# Patient Record
Sex: Male | Born: 1950 | Race: White | Hispanic: No | Marital: Single | Smoking: Never smoker
Health system: Southern US, Community
[De-identification: ages and names within clinical notes are randomized; demographics above are authoritative.]

## PROBLEM LIST (undated history)

## (undated) DIAGNOSIS — M51369 Other intervertebral disc degeneration, lumbar region without mention of lumbar back pain or lower extremity pain: Secondary | ICD-10-CM

## (undated) DIAGNOSIS — K219 Gastro-esophageal reflux disease without esophagitis: Secondary | ICD-10-CM

## (undated) DIAGNOSIS — G4730 Sleep apnea, unspecified: Secondary | ICD-10-CM

## (undated) DIAGNOSIS — I2510 Atherosclerotic heart disease of native coronary artery without angina pectoris: Secondary | ICD-10-CM

## (undated) DIAGNOSIS — R7303 Prediabetes: Secondary | ICD-10-CM

## (undated) DIAGNOSIS — M5136 Other intervertebral disc degeneration, lumbar region: Secondary | ICD-10-CM

## (undated) DIAGNOSIS — E119 Type 2 diabetes mellitus without complications: Secondary | ICD-10-CM

## (undated) DIAGNOSIS — C61 Malignant neoplasm of prostate: Secondary | ICD-10-CM

## (undated) DIAGNOSIS — K635 Polyp of colon: Secondary | ICD-10-CM

## (undated) DIAGNOSIS — N400 Enlarged prostate without lower urinary tract symptoms: Secondary | ICD-10-CM

## (undated) DIAGNOSIS — J189 Pneumonia, unspecified organism: Secondary | ICD-10-CM

## (undated) DIAGNOSIS — J45909 Unspecified asthma, uncomplicated: Secondary | ICD-10-CM

## (undated) HISTORY — DX: Malignant neoplasm of prostate: C61

## (undated) HISTORY — PX: VASECTOMY: SHX75

## (undated) HISTORY — DX: Unspecified asthma, uncomplicated: J45.909

## (undated) HISTORY — DX: Type 2 diabetes mellitus without complications: E11.9

## (undated) HISTORY — DX: Benign prostatic hyperplasia without lower urinary tract symptoms: N40.0

## (undated) HISTORY — DX: Other intervertebral disc degeneration, lumbar region without mention of lumbar back pain or lower extremity pain: M51.369

## (undated) HISTORY — DX: Gastro-esophageal reflux disease without esophagitis: K21.9

## (undated) HISTORY — DX: Polyp of colon: K63.5

## (undated) HISTORY — DX: Sleep apnea, unspecified: G47.30

## (undated) HISTORY — PX: KNEE SURGERY: SHX244

## (undated) HISTORY — DX: Other intervertebral disc degeneration, lumbar region: M51.36

---
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
## 2000-10-18 ENCOUNTER — Ambulatory Visit (HOSPITAL_BASED_OUTPATIENT_CLINIC_OR_DEPARTMENT_OTHER): Admission: RE | Admit: 2000-10-18 | Discharge: 2000-10-18 | Payer: Self-pay | Admitting: Internal Medicine

## 2004-06-01 ENCOUNTER — Encounter: Admission: RE | Admit: 2004-06-01 | Discharge: 2004-06-01 | Payer: Self-pay | Admitting: Family Medicine

## 2005-03-02 ENCOUNTER — Encounter: Admission: RE | Admit: 2005-03-02 | Discharge: 2005-03-02 | Payer: Self-pay | Admitting: Family Medicine

## 2013-06-03 ENCOUNTER — Encounter: Payer: Self-pay | Admitting: Family Medicine

## 2013-06-03 ENCOUNTER — Ambulatory Visit (INDEPENDENT_AMBULATORY_CARE_PROVIDER_SITE_OTHER): Payer: 59 | Admitting: Family Medicine

## 2013-06-03 VITALS — BP 126/78 | HR 88 | Temp 98.9°F | Resp 20 | Ht 73.0 in | Wt 263.0 lb

## 2013-06-03 DIAGNOSIS — J209 Acute bronchitis, unspecified: Secondary | ICD-10-CM

## 2013-06-03 DIAGNOSIS — N400 Enlarged prostate without lower urinary tract symptoms: Secondary | ICD-10-CM | POA: Insufficient documentation

## 2013-06-03 MED ORDER — ZOSTER VACCINE LIVE 19400 UNT/0.65ML ~~LOC~~ SOLR: 0.6500 mL | Freq: Once | SUBCUTANEOUS | Status: DC

## 2013-06-03 MED ORDER — PREDNISONE 20 MG PO TABS: ORAL_TABLET | ORAL | Status: DC

## 2013-06-03 NOTE — Progress Notes (Signed)
  Subjective:    Patient ID: Melvin Morrison, male    DOB: [birth year], 62 y.o.   MRN: [id number]  HPI  Patient reports 3 days of worsening cough, increased wheezing, chest tightness, and mild central pleurisy underneath his sternum.  He denies fevers chills, a productive cough, or hemoptysis.  He has a history of moderate persistent asthma. He has not had an exacerbation in 5 years.  He has no inhaler at home. This began as an upper respiratory infection with some mild rhinorrhea and scratchy throat. Past Medical History  Diagnosis Date  . Asthma   . GERD (gastroesophageal reflux disease)   . BPH (benign prostatic hyperplasia)   . DDD (degenerative disc disease), lumbar     L4-5  . Colon polyps    No current outpatient prescriptions on file prior to visit.   No current facility-administered medications on file prior to visit.   No Known Allergies History   Social History  . Marital Status: Single    Spouse Name: N/A    Number of Children: N/A  . Years of Education: N/A   Occupational History  . Not on file.   Social History Main Topics  . Smoking status: Never Smoker   . Smokeless tobacco: Never Used  . Alcohol Use: Yes     Comment: rare  . Drug Use: No  . Sexual Activity: Yes   Other Topics Concern  . Not on file   Social History Narrative  . No narrative on file   Family History  Problem Relation Age of Onset  . Diabetes Mother   . Diabetes Maternal Grandmother      Review of Systems  All other systems reviewed and are negative.       Objective:   Physical Exam  Vitals reviewed. Constitutional: He appears well-developed and well-nourished.  HENT:  Right Ear: External ear normal.  Left Ear: External ear normal.  Nose: Nose normal.  Mouth/Throat: Oropharynx is clear and moist. No oropharyngeal exudate.  Eyes: Conjunctivae are normal. Pupils are equal, round, and reactive to light.  Neck: Neck supple.  Cardiovascular: Normal rate, regular rhythm and  normal heart sounds.  Exam reveals no gallop and no friction rub.   No murmur heard. Pulmonary/Chest: Effort normal. No respiratory distress. He has wheezes. He has no rales.  Abdominal: Soft. Bowel sounds are normal.  Lymphadenopathy:    He has no cervical adenopathy.   Patient has a prolonged expiratory phase with occasional expiratory wheezes.  There are no crackles/rhonchi/rales on examination.       Assessment & Plan:  1. Acute bronchitis Believe the patient likely had a viral upper respiratory infection which has triggered an asthma exacerbation. He believes he is gradually getting better. Therefore I gave the patient a sample of Ventolin and instructed him to use 2 puffs every 4-6 hours as needed for wheezing or coughing. Also recommended over-the-counter Mucinex for congestion. I recommended tincture of time. If symptoms worsen I would have a low threshold for him to begin taking a prednisone taper pack. I gave him a prescription for prednisone taper pack and instructed him not to begin taking it yet unless his asthma exacerbation worsens. Recheck in 48 hours if no better or sooner if worse. - predniSONE (DELTASONE) 20 MG tablet; 3 tabs poqday 1-2, 1 tab poqday 3-4, 1 poqday 5-6  Dispense: 12 tablet; Refill: 0

## 2013-09-19 ENCOUNTER — Ambulatory Visit (INDEPENDENT_AMBULATORY_CARE_PROVIDER_SITE_OTHER): Payer: 59 | Admitting: Family Medicine

## 2013-09-19 ENCOUNTER — Encounter: Payer: Self-pay | Admitting: Family Medicine

## 2013-09-19 VITALS — BP 130/70 | HR 82 | Temp 98.5°F | Resp 20 | Ht 73.0 in | Wt 264.0 lb

## 2013-09-19 DIAGNOSIS — J45909 Unspecified asthma, uncomplicated: Secondary | ICD-10-CM

## 2013-09-19 MED ORDER — ALBUTEROL SULFATE HFA 108 (90 BASE) MCG/ACT IN AERS: 2.0000 | INHALATION_SPRAY | Freq: Four times a day (QID) | RESPIRATORY_TRACT | Status: DC | PRN

## 2013-09-19 MED ORDER — PREDNISONE 20 MG PO TABS: ORAL_TABLET | ORAL | Status: DC

## 2013-09-19 NOTE — Progress Notes (Signed)
   Subjective:    Patient ID: Melvin Morrison, male    DOB: November 07, 1950, 63 y.o.   MRN: [id number]  HPI Patient presents with one week of chest congestion cough and wheezing. He has underlying history of asthma. He is having use his albuterol 2 puffs every 6 hours as needed for wheezing. His cough is nonproductive. He denies any fevers or chills. He denies any rhinorrhea. He reports some shortness of breath. He denies any pleurisy. He denies any hemoptysis. Past Medical History  Diagnosis Date  . Asthma   . GERD (gastroesophageal reflux disease)   . BPH (benign prostatic hyperplasia)   . DDD (degenerative disc disease), lumbar     L4-5  . Colon polyps    Current Outpatient Prescriptions on File Prior to Visit  Medication Sig Dispense Refill  . alfuzosin (UROXATRAL) 10 MG 24 hr tablet Take 10 mg by mouth daily.      Marland Kitchen. omeprazole (PRILOSEC OTC) 20 MG tablet Take 20 mg by mouth daily.       No current facility-administered medications on file prior to visit.   No Known Allergies History   Social History  . Marital Status: Single    Spouse Name: N/A    Number of Children: N/A  . Years of Education: N/A   Occupational History  . Not on file.   Social History Main Topics  . Smoking status: Never Smoker   . Smokeless tobacco: Never Used  . Alcohol Use: Yes     Comment: rare  . Drug Use: No  . Sexual Activity: Yes   Other Topics Concern  . Not on file   Social History Narrative  . No narrative on file      Review of Systems  All other systems reviewed and are negative.       Objective:   Physical Exam  Vitals reviewed. HENT:  Right Ear: External ear normal.  Left Ear: External ear normal.  Nose: Nose normal.  Mouth/Throat: Oropharynx is clear and moist. No oropharyngeal exudate.  Eyes: Conjunctivae are normal. Pupils are equal, round, and reactive to light. No scleral icterus.  Neck: Normal range of motion. Neck supple.  Cardiovascular: Normal rate, regular  rhythm and normal heart sounds.   Pulmonary/Chest: Effort normal. No respiratory distress. He has wheezes. He has no rales. He exhibits no tenderness.  Lymphadenopathy:    He has no cervical adenopathy.          Assessment & Plan:  1. Asthmatic bronchitis Patient had a viral upper respiratory infection which triggered an asthmatic exacerbation.  Begin prednisone taper pack. Use albuterol 2 puffs inhaled every 6 hours as needed. Use Mucinex DM as needed for cough.  Patient develops fever or pleurisy he is to fill a prescription for a Z-Pak. - predniSONE (DELTASONE) 20 MG tablet; 3 tabs poqday poqday 1-2, 2 tabs poqday 3-4, 1 tab poqday 5-6  Dispense: 12 tablet; Refill: 0 - albuterol (VENTOLIN HFA) 108 (90 BASE) MCG/ACT inhaler; Inhale 2 puffs into the lungs every 6 (six) hours as needed for wheezing or shortness of breath.  Dispense: 1 Inhaler; Refill: 3

## 2013-11-10 ENCOUNTER — Ambulatory Visit (INDEPENDENT_AMBULATORY_CARE_PROVIDER_SITE_OTHER): Payer: 59 | Admitting: Family Medicine

## 2013-11-10 ENCOUNTER — Encounter: Payer: Self-pay | Admitting: Family Medicine

## 2013-11-10 VITALS — BP 118/74 | HR 120 | Temp 100.9°F | Resp 24 | Ht 73.0 in | Wt 260.0 lb

## 2013-11-10 DIAGNOSIS — J209 Acute bronchitis, unspecified: Secondary | ICD-10-CM

## 2013-11-10 MED ORDER — AZITHROMYCIN 250 MG PO TABS: ORAL_TABLET | ORAL | Status: DC

## 2013-11-10 NOTE — Progress Notes (Signed)
Subjective:    Patient ID: Melvin Morrison, male    DOB: 07/05/1951, 63 y.o.   MRN: [id number]  HPI 09/26/13 Patient presents with one week of chest congestion cough and wheezing. He has underlying history of asthma. He is having use his albuterol 2 puffs every 6 hours as needed for wheezing. His cough is nonproductive. He denies any fevers or chills. He denies any rhinorrhea. He reports some shortness of breath. He denies any pleurisy. He denies any hemoptysis.  At that time, my plan was: 1. Asthmatic bronchitis Patient had a viral upper respiratory infection which triggered an asthmatic exacerbation.  Begin prednisone taper pack. Use albuterol 2 puffs inhaled every 6 hours as needed. Use Mucinex DM as needed for cough.  Patient develops fever or pleurisy he is to fill a prescription for a Z-Pak. - predniSONE (DELTASONE) 20 MG tablet; 3 tabs poqday poqday 1-2, 2 tabs poqday 3-4, 1 tab poqday 5-6  Dispense: 12 tablet; Refill: 0 - albuterol (VENTOLIN HFA) 108 (90 BASE) MCG/ACT inhaler; Inhale 2 puffs into the lungs every 6 (six) hours as needed for wheezing or shortness of breath.  Dispense: 1 Inhaler; Refill: 3  11/10/13 Patient symptoms improved but never completely went away. Over the last week he developed severe wheezing, cough that is productive of yellow sputum, and fever of 100.9-101. He is requiring his albuterol to 4 times a day. He is currently having use his albuterol when he gets sick. This happens 2-3 times every dinner. He also frequently needs his albuterol even when he's not sick.  Past Medical History  Diagnosis Date  . Asthma   . GERD (gastroesophageal reflux disease)   . BPH (benign prostatic hyperplasia)   . DDD (degenerative disc disease), lumbar     L4-5  . Colon polyps    Current Outpatient Prescriptions on File Prior to Visit  Medication Sig Dispense Refill  . albuterol (VENTOLIN HFA) 108 (90 BASE) MCG/ACT inhaler Inhale 2 puffs into the lungs every 6 (six) hours as  needed for wheezing or shortness of breath.  1 Inhaler  3  . alfuzosin (UROXATRAL) 10 MG 24 hr tablet Take 10 mg by mouth daily.      Marland Kitchen. omeprazole (PRILOSEC OTC) 20 MG tablet Take 20 mg by mouth daily.       No current facility-administered medications on file prior to visit.   No Known Allergies History   Social History  . Marital Status: Single    Spouse Name: N/A    Number of Children: N/A  . Years of Education: N/A   Occupational History  . Not on file.   Social History Main Topics  . Smoking status: Never Smoker   . Smokeless tobacco: Never Used  . Alcohol Use: Yes     Comment: rare  . Drug Use: No  . Sexual Activity: Yes   Other Topics Concern  . Not on file   Social History Narrative  . No narrative on file      Review of Systems  All other systems reviewed and are negative.       Objective:   Physical Exam  Vitals reviewed. HENT:  Right Ear: External ear normal.  Left Ear: External ear normal.  Nose: Nose normal.  Mouth/Throat: Oropharynx is clear and moist. No oropharyngeal exudate.  Eyes: Conjunctivae are normal. Pupils are equal, round, and reactive to light. No scleral icterus.  Neck: Normal range of motion. Neck supple.  Cardiovascular: Normal rate, regular rhythm and  normal heart sounds.   Pulmonary/Chest: Effort normal. No respiratory distress. He has wheezes. He has no rales. He exhibits no tenderness.  Lymphadenopathy:    He has no cervical adenopathy.          Assessment & Plan:  1. Acute bronchitis I believe the patient has developed bronchitis. I recommended he start taking a Z-Pak. While this will treat the infection today, I believe we can do a better job at preventing his future asthma exacerbations. Therefore I recommended a trial of Symbicort 160/4.5 2 puffs inhaled twice a day. I gave patient samples to last one month. He is to call me in one month to let me know the medicines are helping him reduce his use of rescue  medication.   azithromycin (ZITHROMAX) 250 MG tablet; 2 tabs poqday1, 1 tab poqday 2-5  Dispense: 6 tablet; Refill: 0

## 2013-12-18 ENCOUNTER — Other Ambulatory Visit: Payer: 59

## 2013-12-18 DIAGNOSIS — Z0000 Encounter for general adult medical examination without abnormal findings: Secondary | ICD-10-CM

## 2013-12-18 DIAGNOSIS — E785 Hyperlipidemia, unspecified: Secondary | ICD-10-CM

## 2013-12-18 DIAGNOSIS — Z125 Encounter for screening for malignant neoplasm of prostate: Secondary | ICD-10-CM

## 2013-12-18 LAB — CBC WITH DIFFERENTIAL/PLATELET
BASOS ABS: 0.1 10*3/uL (ref 0.0–0.1)
Basophils Relative: 1 % (ref 0–1)
EOS PCT: 3 % (ref 0–5)
Eosinophils Absolute: 0.2 10*3/uL (ref 0.0–0.7)
HCT: 44.3 % (ref 39.0–52.0)
Hemoglobin: 14.9 g/dL (ref 13.0–17.0)
LYMPHS ABS: 1.8 10*3/uL (ref 0.7–4.0)
LYMPHS PCT: 27 % (ref 12–46)
MCH: 29.2 pg (ref 26.0–34.0)
MCHC: 33.6 g/dL (ref 30.0–36.0)
MCV: 86.9 fL (ref 78.0–100.0)
MONOS PCT: 11 % (ref 3–12)
Monocytes Absolute: 0.7 10*3/uL (ref 0.1–1.0)
NEUTROS ABS: 3.9 10*3/uL (ref 1.7–7.7)
Neutrophils Relative %: 58 % (ref 43–77)
PLATELETS: 248 10*3/uL (ref 150–400)
RBC: 5.1 MIL/uL (ref 4.22–5.81)
RDW: 13.4 % (ref 11.5–15.5)
WBC: 6.8 10*3/uL (ref 4.0–10.5)

## 2013-12-18 LAB — COMPLETE METABOLIC PANEL WITH GFR
ALBUMIN: 4.1 g/dL (ref 3.5–5.2)
ALK PHOS: 63 U/L (ref 39–117)
ALT: 28 U/L (ref 0–53)
AST: 17 U/L (ref 0–37)
BILIRUBIN TOTAL: 1.6 mg/dL — AB (ref 0.2–1.2)
BUN: 16 mg/dL (ref 6–23)
CHLORIDE: 103 meq/L (ref 96–112)
CO2: 28 meq/L (ref 19–32)
CREATININE: 1.08 mg/dL (ref 0.50–1.35)
Calcium: 9.3 mg/dL (ref 8.4–10.5)
GFR, EST AFRICAN AMERICAN: 85 mL/min
GFR, Est Non African American: 74 mL/min
Glucose, Bld: 81 mg/dL (ref 70–99)
Potassium: 4.4 mEq/L (ref 3.5–5.3)
Sodium: 138 mEq/L (ref 135–145)
TOTAL PROTEIN: 6.5 g/dL (ref 6.0–8.3)

## 2013-12-18 LAB — LIPID PANEL
CHOL/HDL RATIO: 4.7 ratio
Cholesterol: 168 mg/dL (ref 0–200)
HDL: 36 mg/dL — AB (ref >39)
LDL CALC: 115 mg/dL — AB (ref 0–99)
TRIGLYCERIDES: 83 mg/dL (ref <150)
VLDL: 17 mg/dL (ref 0–40)

## 2013-12-19 LAB — PSA: PSA: 0.64 ng/mL (ref <=4.00)

## 2013-12-25 ENCOUNTER — Ambulatory Visit (INDEPENDENT_AMBULATORY_CARE_PROVIDER_SITE_OTHER): Payer: 59 | Admitting: Family Medicine

## 2013-12-25 ENCOUNTER — Encounter: Payer: Self-pay | Admitting: Family Medicine

## 2013-12-25 VITALS — BP 146/78 | HR 72 | Temp 98.0°F | Resp 18 | Ht 72.5 in | Wt 259.0 lb

## 2013-12-25 DIAGNOSIS — Z23 Encounter for immunization: Secondary | ICD-10-CM

## 2013-12-25 DIAGNOSIS — Z0000 Encounter for general adult medical examination without abnormal findings: Secondary | ICD-10-CM

## 2013-12-25 MED ORDER — FLUTICASONE-SALMETEROL 250-50 MCG/DOSE IN AEPB: 1.0000 | INHALATION_SPRAY | Freq: Two times a day (BID) | RESPIRATORY_TRACT | Status: DC

## 2013-12-25 MED ORDER — BUDESONIDE-FORMOTEROL FUMARATE 160-4.5 MCG/ACT IN AERO: 2.0000 | INHALATION_SPRAY | Freq: Two times a day (BID) | RESPIRATORY_TRACT | Status: DC

## 2013-12-25 NOTE — Addendum Note (Signed)
Addended by: Legrand RamsWILLIS, SANDY B on: 12/25/2013 09:11 AM   Modules accepted: Orders

## 2013-12-25 NOTE — Progress Notes (Signed)
Subjective:    Patient ID: Melvin Morrison, male    DOB: January 15, 1951, 63 y.o.   MRN: [id number]  HPI Patient is a very pleasant 63 year old white male who comes in today for complete physical exam. He has no medical concerns. His last colonoscopy was in 2013 has his prostate checked every year by his urologist. His PSA this month was completely normal. Tetanus shot was in 2008. He has never had a pneumonia vaccine. His most recent labwork as listed below: Lab on 12/18/2013  Component Date Value Ref Range Status  . Cholesterol 12/18/2013 168  0 - 200 mg/dL Final   Comment: ATP III Classification:                                < 200        mg/dL        Desirable                               200 - 239     mg/dL        Borderline High                               >= 240        mg/dL        High                             . Triglycerides 12/18/2013 83  <150 mg/dL Final  . HDL 12/18/2013 36* >39 mg/dL Final  . Total CHOL/HDL Ratio 12/18/2013 4.7   Final  . VLDL 12/18/2013 17  0 - 40 mg/dL Final  . LDL Cholesterol 12/18/2013 115* 0 - 99 mg/dL Final   Comment:                            Total Cholesterol/HDL Ratio:CHD Risk                                                 Coronary Heart Disease Risk Table                                                                 Men       Women                                   1/2 Average Risk              3.4        3.3                                       Average Risk  5.0        4.4                                    2X Average Risk              9.6        7.1                                    3X Average Risk             23.4       11.0                          Use the calculated Patient Ratio above and the CHD Risk table                           to determine the patient's CHD Risk.                          ATP III Classification (LDL):                                < 100        mg/dL         Optimal   100 - 129     mg/dL         Near or Above Optimal                               130 - 159     mg/dL         Borderline High                               160 - 189     mg/dL         High                                > 190        mg/dL         Very High                             . PSA 12/18/2013 0.64  <=4.00 ng/mL Final   Comment: Test Methodology: ECLIA PSA (Electrochemiluminescence Immunoassay)                                                     For PSA values from 2.5-4.0, particularly in younger men <60 years                          old, the AUA and NCCN suggest testing for % Free PSA (3515) and  evaluation of the rate of increase in PSA (PSA velocity).  . WBC 12/18/2013 6.8  4.0 - 10.5 K/uL Final  . RBC 12/18/2013 5.10  4.22 - 5.81 MIL/uL Final  . Hemoglobin 12/18/2013 14.9  13.0 - 17.0 g/dL Final  . HCT 12/18/2013 44.3  39.0 - 52.0 % Final  . MCV 12/18/2013 86.9  78.0 - 100.0 fL Final  . MCH 12/18/2013 29.2  26.0 - 34.0 pg Final  . MCHC 12/18/2013 33.6  30.0 - 36.0 g/dL Final  . RDW 12/18/2013 13.4  11.5 - 15.5 % Final  . Platelets 12/18/2013 248  150 - 400 K/uL Final  . Neutrophils Relative % 12/18/2013 58  43 - 77 % Final  . Neutro Abs 12/18/2013 3.9  1.7 - 7.7 K/uL Final  . Lymphocytes Relative 12/18/2013 27  12 - 46 % Final  . Lymphs Abs 12/18/2013 1.8  0.7 - 4.0 K/uL Final  . Monocytes Relative 12/18/2013 11  3 - 12 % Final  . Monocytes Absolute 12/18/2013 0.7  0.1 - 1.0 K/uL Final  . Eosinophils Relative 12/18/2013 3  0 - 5 % Final  . Eosinophils Absolute 12/18/2013 0.2  0.0 - 0.7 K/uL Final  . Basophils Relative 12/18/2013 1  0 - 1 % Final  . Basophils Absolute 12/18/2013 0.1  0.0 - 0.1 K/uL Final  . Smear Review 12/18/2013 Criteria for review not met   Final  . Sodium 12/18/2013 138  135 - 145 mEq/L Final  . Potassium 12/18/2013 4.4  3.5 - 5.3 mEq/L Final  . Chloride 12/18/2013 103  96 - 112 mEq/L Final  . CO2 12/18/2013 28  19 - 32 mEq/L  Final  . Glucose, Bld 12/18/2013 81  70 - 99 mg/dL Final  . BUN 12/18/2013 16  6 - 23 mg/dL Final  . Creat 12/18/2013 1.08  0.50 - 1.35 mg/dL Final  . Total Bilirubin 12/18/2013 1.6* 0.2 - 1.2 mg/dL Final  . Alkaline Phosphatase 12/18/2013 63  39 - 117 U/L Final  . AST 12/18/2013 17  0 - 37 U/L Final  . ALT 12/18/2013 28  0 - 53 U/L Final  . Total Protein 12/18/2013 6.5  6.0 - 8.3 g/dL Final  . Albumin 12/18/2013 4.1  3.5 - 5.2 g/dL Final  . Calcium 12/18/2013 9.3  8.4 - 10.5 mg/dL Final  . GFR, Est African American 12/18/2013 85   Final  . GFR, Est Non African American 12/18/2013 74   Final   Comment:                            The estimated GFR is a calculation valid for adults (>=41 years old)                          that uses the CKD-EPI algorithm to adjust for age and sex. It is                            not to be used for children, pregnant women, hospitalized patients,                             patients on dialysis, or with rapidly changing kidney function.  According to the NKDEP, eGFR >89 is normal, 60-89 shows mild                          impairment, 30-59 shows moderate impairment, 15-29 shows severe                          impairment and <15 is ESRD.                              Past Medical History  Diagnosis Date  . Asthma   . GERD (gastroesophageal reflux disease)   . BPH (benign prostatic hyperplasia)   . DDD (degenerative disc disease), lumbar     L4-5  . Colon polyps    No past surgical history on file. Current Outpatient Prescriptions on File Prior to Visit  Medication Sig Dispense Refill  . alfuzosin (UROXATRAL) 10 MG 24 hr tablet Take 10 mg by mouth daily.      Marland Kitchen omeprazole (PRILOSEC OTC) 20 MG tablet Take 20 mg by mouth daily.       No current facility-administered medications on file prior to visit.   No Known Allergies History   Social History  . Marital Status: Single    Spouse Name: N/A    Number of Children: N/A    . Years of Education: N/A   Occupational History  . Not on file.   Social History Main Topics  . Smoking status: Never Smoker   . Smokeless tobacco: Never Used  . Alcohol Use: Yes     Comment: rare  . Drug Use: No  . Sexual Activity: Yes     Comment: married, Furniture conservator/restorer.   Other Topics Concern  . Not on file   Social History Narrative  . No narrative on file   Family History  Problem Relation Age of Onset  . Diabetes Mother   . Diabetes Maternal Grandmother       Review of Systems  All other systems reviewed and are negative.      Objective:   Physical Exam  Vitals reviewed. Constitutional: He is oriented to person, place, and time. He appears well-developed and well-nourished. No distress.  HENT:  Head: Normocephalic and atraumatic.  Right Ear: External ear normal.  Left Ear: External ear normal.  Nose: Nose normal.  Mouth/Throat: Oropharynx is clear and moist. No oropharyngeal exudate.  Eyes: Conjunctivae and EOM are normal. Pupils are equal, round, and reactive to light. Right eye exhibits no discharge. Left eye exhibits no discharge. No scleral icterus.  Neck: Normal range of motion. Neck supple. No JVD present. No tracheal deviation present. No thyromegaly present.  Cardiovascular: Normal rate, regular rhythm, normal heart sounds and intact distal pulses.  Exam reveals no gallop and no friction rub.   No murmur heard. Pulmonary/Chest: Effort normal and breath sounds normal. No stridor. No respiratory distress. He has no wheezes. He has no rales. He exhibits no tenderness.  Abdominal: Soft. Bowel sounds are normal. He exhibits no distension and no mass. There is no tenderness. There is no rebound and no guarding.  Musculoskeletal: Normal range of motion. He exhibits no edema and no tenderness.  Lymphadenopathy:    He has no cervical adenopathy.  Neurological: He is alert and oriented to person, place, and time. He has normal reflexes. He displays normal  reflexes. No cranial nerve deficit. He exhibits normal muscle tone.  Coordination normal.  Skin: Skin is warm. No rash noted. He is not diaphoretic. No erythema. No pallor.  Psychiatric: He has a normal mood and affect. His behavior is normal. Judgment and thought content normal.          Assessment & Plan:  1. Routine general medical examination at a health care facility Physical exam today is significant for elevated weight and mild elevated blood pressures the patient has never had elevated the past. Recommended he check his blood pressure frequently at home and consistently through the top of a greater than 140 greater than 90 incident we continued. His lab is excellent. He would have his rectal exam performed at his urologist later this month. Did give the patient Pneumovax 23 because of his asthma history. I recommend 13 and 65. I recommend a booster of Pneumovax 23 in 5 years. His colonoscopy is up to date.

## 2013-12-25 NOTE — Addendum Note (Signed)
Addended by: Legrand RamsWILLIS, Trinka Keshishyan B on: 12/25/2013 09:41 AM   Modules accepted: Orders, Medications

## 2014-03-03 ENCOUNTER — Other Ambulatory Visit: Payer: Self-pay | Admitting: Orthopedic Surgery

## 2014-03-03 DIAGNOSIS — M25562 Pain in left knee: Secondary | ICD-10-CM

## 2014-03-06 ENCOUNTER — Other Ambulatory Visit: Payer: Self-pay

## 2014-03-11 ENCOUNTER — Ambulatory Visit
Admission: RE | Admit: 2014-03-11 | Discharge: 2014-03-11 | Disposition: A | Discharge status: Home / Self Care | Payer: 59 | Source: Ambulatory Visit | Attending: Orthopedic Surgery | Admitting: Orthopedic Surgery

## 2014-03-11 DIAGNOSIS — M25562 Pain in left knee: Secondary | ICD-10-CM

## 2014-05-04 ENCOUNTER — Telehealth: Payer: Self-pay | Admitting: Family Medicine

## 2014-05-04 DIAGNOSIS — K429 Umbilical hernia without obstruction or gangrene: Secondary | ICD-10-CM

## 2014-05-04 NOTE — Telephone Encounter (Signed)
When patient was in last, we told him he had a hernia, he is now ready to get something scheduled for this, he is saying he does not want to see dr gerkin at [facility]. Anybody else is fine  Phone number to call back is [phone]

## 2014-05-04 NOTE — Telephone Encounter (Signed)
Ok with referral. 

## 2014-05-04 NOTE — Telephone Encounter (Signed)
Do you know anything about this? I do not see it in his note about a hernia.

## 2014-05-05 NOTE — Telephone Encounter (Signed)
[facility]

## 2014-05-07 NOTE — Telephone Encounter (Signed)
Umbilical hernia

## 2014-05-07 NOTE — Telephone Encounter (Signed)
Referral ordered

## 2014-05-26 ENCOUNTER — Other Ambulatory Visit (INDEPENDENT_AMBULATORY_CARE_PROVIDER_SITE_OTHER): Payer: Self-pay

## 2014-05-26 ENCOUNTER — Ambulatory Visit (INDEPENDENT_AMBULATORY_CARE_PROVIDER_SITE_OTHER): Payer: 59 | Admitting: General Surgery

## 2014-05-26 DIAGNOSIS — R222 Localized swelling, mass and lump, trunk: Secondary | ICD-10-CM

## 2014-06-01 ENCOUNTER — Other Ambulatory Visit (INDEPENDENT_AMBULATORY_CARE_PROVIDER_SITE_OTHER): Payer: Self-pay | Admitting: General Surgery

## 2014-06-01 ENCOUNTER — Ambulatory Visit
Admission: RE | Admit: 2014-06-01 | Discharge: 2014-06-01 | Disposition: A | Discharge status: Home / Self Care | Payer: 59 | Source: Ambulatory Visit | Attending: General Surgery | Admitting: General Surgery

## 2014-06-01 ENCOUNTER — Encounter (INDEPENDENT_AMBULATORY_CARE_PROVIDER_SITE_OTHER): Payer: Self-pay

## 2014-06-01 DIAGNOSIS — R222 Localized swelling, mass and lump, trunk: Secondary | ICD-10-CM

## 2014-07-03 ENCOUNTER — Other Ambulatory Visit: Payer: Self-pay

## 2015-01-11 ENCOUNTER — Ambulatory Visit (INDEPENDENT_AMBULATORY_CARE_PROVIDER_SITE_OTHER): Payer: 59 | Admitting: Family Medicine

## 2015-01-11 ENCOUNTER — Encounter: Payer: Self-pay | Admitting: Family Medicine

## 2015-01-11 VITALS — BP 110/70 | HR 62 | Temp 98.7°F | Resp 20 | Ht 73.0 in | Wt 268.0 lb

## 2015-01-11 DIAGNOSIS — J4520 Mild intermittent asthma, uncomplicated: Secondary | ICD-10-CM | POA: Diagnosis not present

## 2015-01-11 DIAGNOSIS — R5382 Chronic fatigue, unspecified: Secondary | ICD-10-CM

## 2015-01-11 LAB — CBC WITH DIFFERENTIAL/PLATELET
BASOS ABS: 0 10*3/uL (ref 0.0–0.1)
BASOS PCT: 0 % (ref 0–1)
EOS ABS: 0.2 10*3/uL (ref 0.0–0.7)
EOS PCT: 3 % (ref 0–5)
HCT: 47.2 % (ref 39.0–52.0)
HEMOGLOBIN: 15.7 g/dL (ref 13.0–17.0)
LYMPHS ABS: 2 10*3/uL (ref 0.7–4.0)
Lymphocytes Relative: 26 % (ref 12–46)
MCH: 29.6 pg (ref 26.0–34.0)
MCHC: 33.3 g/dL (ref 30.0–36.0)
MCV: 88.9 fL (ref 78.0–100.0)
MONOS PCT: 13 % — AB (ref 3–12)
MPV: 9.6 fL (ref 8.6–12.4)
Monocytes Absolute: 1 10*3/uL (ref 0.1–1.0)
NEUTROS ABS: 4.4 10*3/uL (ref 1.7–7.7)
NEUTROS PCT: 58 % (ref 43–77)
PLATELETS: 269 10*3/uL (ref 150–400)
RBC: 5.31 MIL/uL (ref 4.22–5.81)
RDW: 13.2 % (ref 11.5–15.5)
WBC: 7.6 10*3/uL (ref 4.0–10.5)

## 2015-01-11 MED ORDER — ALBUTEROL SULFATE HFA 108 (90 BASE) MCG/ACT IN AERS: 2.0000 | INHALATION_SPRAY | Freq: Four times a day (QID) | RESPIRATORY_TRACT | Status: DC | PRN

## 2015-01-11 NOTE — Progress Notes (Signed)
Subjective:    Patient ID: Melvin Morrison, male    DOB: 1951/03/10, 64 y.o.   MRN: [id number]  HPI Patient has a history of mild asthma versus reactive airway disease. He uses albuterol infrequently. This time year he may use it once a week for wheezing related to allergies. He would like a refill on his albuterol. However the patient is also concerned due to fatigue the patient is been having. Patient is not hypersomnolent. Instead he is extremely physically tired. He has very little drive to do anything. He has noticed decreasing muscle mass, poor libido, erectile dysfunction. He is noticed decreasing stamina. He denies any muscle pains. He denies any chest pain shortness of breath or dyspnea on exertion. He denies any fevers chills weight loss or easy bruising. He denies any depression. Past Medical History  Diagnosis Date  . Asthma   . GERD (gastroesophageal reflux disease)   . BPH (benign prostatic hyperplasia)   . DDD (degenerative disc disease), lumbar     L4-5  . Colon polyps    No past surgical history on file. Current Outpatient Prescriptions on File Prior to Visit  Medication Sig Dispense Refill  . alfuzosin (UROXATRAL) 10 MG 24 hr tablet Take 10 mg by mouth daily.    Marland Kitchen omeprazole (PRILOSEC OTC) 20 MG tablet Take 20 mg by mouth daily.     No current facility-administered medications on file prior to visit.   No Known Allergies History   Social History  . Marital Status: Single    Spouse Name: N/A  . Number of Children: N/A  . Years of Education: N/A   Occupational History  . Not on file.   Social History Main Topics  . Smoking status: Never Smoker   . Smokeless tobacco: Never Used  . Alcohol Use: Yes     Comment: rare  . Drug Use: No  . Sexual Activity: Yes     Comment: married, Chartered certified accountant.   Other Topics Concern  . Not on file   Social History Narrative      Review of Systems  All other systems reviewed and are negative.      Objective:   Physical Exam  Constitutional: He appears well-developed and well-nourished.  Neck: Neck supple. No thyromegaly present.  Cardiovascular: Normal rate, regular rhythm and normal heart sounds.   No murmur heard. Pulmonary/Chest: Effort normal and breath sounds normal. No respiratory distress. He has no wheezes. He has no rales.  Abdominal: Soft. Bowel sounds are normal. He exhibits no distension. There is no tenderness. There is no rebound.  Musculoskeletal: He exhibits no edema.  Lymphadenopathy:    He has no cervical adenopathy.  Vitals reviewed.         Assessment & Plan:  Reactive airway disease, mild intermittent, uncomplicated - Plan: albuterol (PROVENTIL HFA;VENTOLIN HFA) 108 (90 BASE) MCG/ACT inhaler  Chronic fatigue - Plan: CBC with Differential/Platelet, COMPLETE METABOLIC PANEL WITH GFR, TSH, Testosterone, free  Patient has mild reactive airway disease. I will gladly refill his albuterol inhaler. I informed the patient if he is using albuterol more than 2 times a week or more than 2 nights out of a month, that he should consider an inhaled steroid to help prevent the attacks. 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 I will check a CBC to monitor for anemia or other bone marrow problems. I will check a CMP to monitor kidney and liver function as well as for blood sugar. I will check  a TSH. I will also check a free testosterone. If the free testosterone level is low, I would like to see the patient back for a FSH, LH, and to discuss the risk and benefits of testosterone replacement

## 2015-01-12 LAB — COMPLETE METABOLIC PANEL WITH GFR
ALBUMIN: 4.1 g/dL (ref 3.5–5.2)
ALT: 26 U/L (ref 0–53)
AST: 19 U/L (ref 0–37)
Alkaline Phosphatase: 61 U/L (ref 39–117)
BILIRUBIN TOTAL: 0.9 mg/dL (ref 0.2–1.2)
BUN: 13 mg/dL (ref 6–23)
CO2: 26 mEq/L (ref 19–32)
CREATININE: 1.09 mg/dL (ref 0.50–1.35)
Calcium: 9.1 mg/dL (ref 8.4–10.5)
Chloride: 103 mEq/L (ref 96–112)
GFR, EST AFRICAN AMERICAN: 84 mL/min
GFR, EST NON AFRICAN AMERICAN: 72 mL/min
GLUCOSE: 84 mg/dL (ref 70–99)
POTASSIUM: 4.3 meq/L (ref 3.5–5.3)
Sodium: 138 mEq/L (ref 135–145)
Total Protein: 6.7 g/dL (ref 6.0–8.3)

## 2015-01-12 LAB — TSH: TSH: 2.554 u[IU]/mL (ref 0.350–4.500)

## 2015-01-12 LAB — TESTOSTERONE, FREE

## 2015-01-15 LAB — TESTOSTERONE, FREE, TOTAL, SHBG
SEX HORMONE BINDING: 43 nmol/L (ref 22–77)
TESTOSTERONE FREE: 47.5 pg/mL (ref 47.0–244.0)
TESTOSTERONE-% FREE: 1.7 % (ref 1.6–2.9)
TESTOSTERONE: 287 ng/dL — AB (ref 300–890)

## 2016-03-28 ENCOUNTER — Encounter: Payer: Self-pay | Admitting: Family Medicine

## 2016-03-28 ENCOUNTER — Ambulatory Visit (INDEPENDENT_AMBULATORY_CARE_PROVIDER_SITE_OTHER): Payer: 59 | Admitting: Family Medicine

## 2016-03-28 VITALS — BP 130/70 | HR 60 | Temp 98.1°F | Resp 16 | Ht 73.0 in | Wt 275.0 lb

## 2016-03-28 DIAGNOSIS — J4520 Mild intermittent asthma, uncomplicated: Secondary | ICD-10-CM

## 2016-03-28 DIAGNOSIS — Z0000 Encounter for general adult medical examination without abnormal findings: Secondary | ICD-10-CM

## 2016-03-28 LAB — CBC WITH DIFFERENTIAL/PLATELET
BASOS ABS: 56 {cells}/uL (ref 0–200)
Basophils Relative: 1 %
EOS PCT: 2 %
Eosinophils Absolute: 112 cells/uL (ref 15–500)
HCT: 46.1 % (ref 38.5–50.0)
HEMOGLOBIN: 15.2 g/dL (ref 13.0–17.0)
LYMPHS ABS: 1736 {cells}/uL (ref 850–3900)
LYMPHS PCT: 31 %
MCH: 29.2 pg (ref 27.0–33.0)
MCHC: 33 g/dL (ref 32.0–36.0)
MCV: 88.7 fL (ref 80.0–100.0)
MONOS PCT: 14 %
MPV: 9.8 fL (ref 7.5–12.5)
Monocytes Absolute: 784 cells/uL (ref 200–950)
NEUTROS PCT: 52 %
Neutro Abs: 2912 cells/uL (ref 1500–7800)
Platelets: 259 10*3/uL (ref 140–400)
RBC: 5.2 MIL/uL (ref 4.20–5.80)
RDW: 13.3 % (ref 11.0–15.0)
WBC: 5.6 10*3/uL (ref 3.8–10.8)

## 2016-03-28 LAB — COMPLETE METABOLIC PANEL WITH GFR
ALBUMIN: 4.1 g/dL (ref 3.6–5.1)
ALT: 29 U/L (ref 9–46)
AST: 20 U/L (ref 10–35)
Alkaline Phosphatase: 62 U/L (ref 40–115)
BILIRUBIN TOTAL: 1 mg/dL (ref 0.2–1.2)
BUN: 14 mg/dL (ref 7–25)
CHLORIDE: 104 mmol/L (ref 98–110)
CO2: 26 mmol/L (ref 20–31)
Calcium: 9 mg/dL (ref 8.6–10.3)
Creat: 1.11 mg/dL (ref 0.70–1.25)
GFR, EST NON AFRICAN AMERICAN: 70 mL/min (ref >=60)
GFR, Est African American: 81 mL/min (ref >=60)
GLUCOSE: 96 mg/dL (ref 70–99)
POTASSIUM: 4.4 mmol/L (ref 3.5–5.3)
SODIUM: 139 mmol/L (ref 135–146)
Total Protein: 6.6 g/dL (ref 6.1–8.1)

## 2016-03-28 LAB — LIPID PANEL
Cholesterol: 170 mg/dL (ref 125–200)
HDL: 36 mg/dL — ABNORMAL LOW (ref >=40)
LDL CALC: 115 mg/dL (ref <130)
TRIGLYCERIDES: 97 mg/dL (ref <150)
Total CHOL/HDL Ratio: 4.7 Ratio (ref <=5.0)
VLDL: 19 mg/dL (ref <30)

## 2016-03-28 MED ORDER — ALBUTEROL SULFATE HFA 108 (90 BASE) MCG/ACT IN AERS: 2.0000 | INHALATION_SPRAY | Freq: Four times a day (QID) | RESPIRATORY_TRACT | Status: DC | PRN

## 2016-03-28 NOTE — Progress Notes (Signed)
Subjective:    Patient ID: Melvin Morrison, male    DOB: 1950/12/12, 65 y.o.   MRN: [id number]  HPI Patient is here today for complete physical exam. He denies any medical concerns. Immunizations are up-to-date. Immunization History  Administered Date(s) Administered  . Influenza-Unspecified 06/29/2015  . Pneumococcal Polysaccharide-23 12/25/2013   Patient reports that he has had a shingles vaccine in the past. He declines hepatitis C and HIV screening. He does request a refill on his albuterol which he uses occasionally when he mows the yard but otherwise he has not had an asthma exacerbation several years. Past Medical History  Diagnosis Date  . Asthma   . GERD (gastroesophageal reflux disease)   . BPH (benign prostatic hyperplasia)   . DDD (degenerative disc disease), lumbar     L4-5  . Colon polyps    No past surgical history on file. Current Outpatient Prescriptions on File Prior to Visit  Medication Sig Dispense Refill  . alfuzosin (UROXATRAL) 10 MG 24 hr tablet Take 10 mg by mouth daily.     No current facility-administered medications on file prior to visit.   No Known Allergies Social History   Social History  . Marital Status: Single    Spouse Name: N/A  . Number of Children: N/A  . Years of Education: N/A   Occupational History  . Not on file.   Social History Main Topics  . Smoking status: Never Smoker   . Smokeless tobacco: Never Used  . Alcohol Use: Yes     Comment: rare  . Drug Use: No  . Sexual Activity: Yes     Comment: married, Chartered certified accountantmachinist.   Other Topics Concern  . Not on file   Social History Narrative   Family History  Problem Relation Age of Onset  . Diabetes Mother   . Diabetes Maternal Grandmother        Review of Systems  All other systems reviewed and are negative.      Objective:   Physical Exam  Constitutional: He is oriented to person, place, and time. He appears well-developed and well-nourished. No distress.  HENT:   Head: Normocephalic and atraumatic.  Right Ear: External ear normal.  Left Ear: External ear normal.  Nose: Nose normal.  Mouth/Throat: Oropharynx is clear and moist. No oropharyngeal exudate.  Eyes: Conjunctivae and EOM are normal. Pupils are equal, round, and reactive to light. Right eye exhibits no discharge. Left eye exhibits no discharge. No scleral icterus.  Neck: Normal range of motion. Neck supple. No JVD present. No tracheal deviation present. No thyromegaly present.  Cardiovascular: Normal rate, regular rhythm, normal heart sounds and intact distal pulses.  Exam reveals no gallop and no friction rub.   No murmur heard. Pulmonary/Chest: Effort normal and breath sounds normal. No stridor. No respiratory distress. He has no wheezes. He has no rales. He exhibits no tenderness.  Abdominal: Soft. Bowel sounds are normal. He exhibits no distension and no mass. There is no tenderness. There is no rebound and no guarding.  Musculoskeletal: Normal range of motion. He exhibits no edema or tenderness.  Lymphadenopathy:    He has no cervical adenopathy.  Neurological: He is alert and oriented to person, place, and time. He has normal reflexes. He displays normal reflexes. No cranial nerve deficit. He exhibits normal muscle tone. Coordination normal.  Skin: Skin is warm. No rash noted. He is not diaphoretic. No erythema. No pallor.  Psychiatric: He has a normal mood and affect. His behavior  is normal. Judgment and thought content normal.  Vitals reviewed.         Assessment & Plan:  Routine general medical examination at a health care facility - Plan: CBC with Differential/Platelet, COMPLETE METABOLIC PANEL WITH GFR, Lipid panel  Reactive airway disease, mild intermittent, uncomplicated - Plan: albuterol (PROVENTIL HFA;VENTOLIN HFA) 108 (90 Base) MCG/ACT inhaler Physical exam today is normal. Recommended diet exercise and weight loss. Colonoscopy is up-to-date. Last colonoscopy was 2011. He  gets his prostate exam and his urologist along with his PSA. Immunizations are up-to-date including the shingles vaccine. I will check a CBC, CMP, fasting lipid panel. I refilled his albuterol. He declines hepatitis C screening and HIV screening

## 2016-03-29 ENCOUNTER — Encounter: Payer: Self-pay | Admitting: Family Medicine

## 2016-06-28 ENCOUNTER — Encounter: Payer: Self-pay | Admitting: Podiatry

## 2016-06-28 ENCOUNTER — Ambulatory Visit (INDEPENDENT_AMBULATORY_CARE_PROVIDER_SITE_OTHER): Payer: 59

## 2016-06-28 ENCOUNTER — Ambulatory Visit (INDEPENDENT_AMBULATORY_CARE_PROVIDER_SITE_OTHER): Payer: 59 | Admitting: Podiatry

## 2016-06-28 VITALS — BP 128/80 | HR 97 | Resp 16 | Ht 73.0 in | Wt 270.0 lb

## 2016-06-28 DIAGNOSIS — M79671 Pain in right foot: Secondary | ICD-10-CM | POA: Diagnosis not present

## 2016-06-28 DIAGNOSIS — M79672 Pain in left foot: Secondary | ICD-10-CM | POA: Diagnosis not present

## 2016-06-28 DIAGNOSIS — M722 Plantar fascial fibromatosis: Secondary | ICD-10-CM | POA: Diagnosis not present

## 2016-06-28 NOTE — Progress Notes (Signed)
Subjective:     Patient ID: Melvin Morrison, male   DOB: [birth year], 65 y.o.   MRN: [id number]  HPI patient presents stating I am getting pain in my feet at times and my orthotics are no longer supporting me properly   Review of Systems  All other systems reviewed and are negative.      Objective:   Physical Exam  Constitutional: He is oriented to person, place, and time.  Cardiovascular: Intact distal pulses.   Musculoskeletal: Normal range of motion.  Neurological: He is oriented to person, place, and time.  Skin: Skin is warm.  Nursing note and vitals reviewed.  neurovascular status intact muscle strength adequate range of motion within normal limits with patient found to have discomfort in the feet at different times with moderate depression of the arch noted. Patient's found have good digital perfusion and is well oriented 3     Assessment:     Fasciitis-like symptoms with long-term orthotics which have lost their ability to provide support    Plan:     Reviewed conditions and scanned for new custom orthotics and explained support therapy shoe gear modifications and will be seen back to recheck  X-rays indicate spur with no indications of stress fracture or arthritis

## 2016-06-28 NOTE — Progress Notes (Signed)
   Subjective:    Patient ID: Melvin Morrison, male    DOB: 08/18/1951, 65 y.o.   MRN: [id number]  HPI Chief Complaint  Patient presents with  . Foot Orthotics    Pt wants to get new ones made today      Review of Systems  All other systems reviewed and are negative.      Objective:   Physical Exam        Assessment & Plan:

## 2016-07-28 ENCOUNTER — Encounter: Payer: Self-pay | Admitting: Podiatry

## 2016-07-28 ENCOUNTER — Ambulatory Visit (INDEPENDENT_AMBULATORY_CARE_PROVIDER_SITE_OTHER): Payer: 59 | Admitting: Podiatry

## 2016-07-28 DIAGNOSIS — M722 Plantar fascial fibromatosis: Secondary | ICD-10-CM

## 2016-07-28 NOTE — Patient Instructions (Signed)

## 2016-07-29 NOTE — Progress Notes (Signed)
Subjective:     Patient ID: Melvin Morrison, male   DOB: 1950/11/16, 65 y.o.   MRN: [id number]  HPI patient presents to pickup orthotics   Review of Systems     Objective:   Physical Exam No change in health with pain still present    Assessment:     Tendinitis    Plan:     Orthotics dispensed and we'll rehabilitation current pair

## 2016-08-07 ENCOUNTER — Ambulatory Visit: Payer: 59 | Admitting: Podiatry

## 2016-08-08 ENCOUNTER — Encounter: Payer: Self-pay | Admitting: Family Medicine

## 2016-08-08 ENCOUNTER — Ambulatory Visit (INDEPENDENT_AMBULATORY_CARE_PROVIDER_SITE_OTHER): Payer: 59 | Admitting: Family Medicine

## 2016-08-08 VITALS — BP 142/82 | HR 78 | Temp 99.3°F | Resp 20 | Ht 72.0 in | Wt 277.0 lb

## 2016-08-08 DIAGNOSIS — J4531 Mild persistent asthma with (acute) exacerbation: Secondary | ICD-10-CM

## 2016-08-08 MED ORDER — AZITHROMYCIN 250 MG PO TABS: ORAL_TABLET | ORAL | 0 refills | Status: DC

## 2016-08-08 MED ORDER — PREDNISONE 20 MG PO TABS: ORAL_TABLET | ORAL | 0 refills | Status: DC

## 2016-08-08 NOTE — Progress Notes (Signed)
   Subjective:    Patient ID: Melvin Morrison, male    DOB: 1951-03-03, 65 y.o.   MRN: [id number]  HPI Patient has a history of mild asthma versus reactive airway disease.However 3 days ago, the patient developed a severe cough and a low-grade fever. Cough is productive of yellow mucus. He also reports pleurisy. On his exam today he has diffuse expiratory wheezing with diminished airflow and rhonchorous breath sounds throughout right greater than left. Past Medical History:  Diagnosis Date  . Asthma   . BPH (benign prostatic hyperplasia)   . Colon polyps   . DDD (degenerative disc disease), lumbar    L4-5  . GERD (gastroesophageal reflux disease)    No past surgical history on file. Current Outpatient Prescriptions on File Prior to Visit  Medication Sig Dispense Refill  . albuterol (PROVENTIL HFA;VENTOLIN HFA) 108 (90 Base) MCG/ACT inhaler Inhale 2 puffs into the lungs every 6 (six) hours as needed for wheezing or shortness of breath. 1 Inhaler 3  . alfuzosin (UROXATRAL) 10 MG 24 hr tablet Take 10 mg by mouth daily.    Marland Kitchen. esomeprazole (NEXIUM) 20 MG capsule Take 20 mg by mouth daily at 12 noon.     No current facility-administered medications on file prior to visit.    No Known Allergies Social History   Social History  . Marital status: Single    Spouse name: N/A  . Number of children: N/A  . Years of education: N/A   Occupational History  . Not on file.   Social History Main Topics  . Smoking status: Never Smoker  . Smokeless tobacco: Never Used  . Alcohol use Yes     Comment: rare  . Drug use: No  . Sexual activity: Yes     Comment: married, Chartered certified accountantmachinist.   Other Topics Concern  . Not on file   Social History Narrative  . No narrative on file      Review of Systems  All other systems reviewed and are negative.      Objective:   Physical Exam  Constitutional: He appears well-developed and well-nourished.  Neck: Neck supple. No thyromegaly present.    Cardiovascular: Normal rate, regular rhythm and normal heart sounds.   No murmur heard. Pulmonary/Chest: Effort normal. No respiratory distress. He has wheezes. He has rhonchi. He has no rales.  Abdominal: Soft. Bowel sounds are normal. He exhibits no distension. There is no tenderness. There is no rebound.  Musculoskeletal: He exhibits no edema.  Lymphadenopathy:    He has no cervical adenopathy.  Vitals reviewed.         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 Begin prednisone taper pack. Supplement with albuterol 2 puffs inhaled every 6 hours. Add Z-Pak for bacterial bronchitis given the congestion in his lungs. Recheck next week if no better or sooner if worse

## 2016-08-17 ENCOUNTER — Ambulatory Visit (INDEPENDENT_AMBULATORY_CARE_PROVIDER_SITE_OTHER): Payer: 59 | Admitting: Podiatry

## 2016-08-17 ENCOUNTER — Encounter: Payer: Self-pay | Admitting: Podiatry

## 2016-08-17 DIAGNOSIS — M79671 Pain in right foot: Secondary | ICD-10-CM

## 2016-08-17 DIAGNOSIS — M722 Plantar fascial fibromatosis: Secondary | ICD-10-CM | POA: Diagnosis not present

## 2016-08-17 DIAGNOSIS — M79672 Pain in left foot: Secondary | ICD-10-CM | POA: Diagnosis not present

## 2016-08-17 NOTE — Progress Notes (Signed)
Subjective:     Patient ID: Melvin Morrison, male   DOB: 22-Jan-1951, 65 y.o.   MRN: [id number]  HPI patient states he still having some discomfort with the left foot but overall it is improved over where it was but the left orthotic does not seem to fit well   Review of Systems     Objective:   Physical Exam Neurovascular status intact with no other pathology noted with a slight difference between the left and right orthotic    Assessment:     Stress on the orthotic with foot structure as part of the problem    Plan:     X-rays reviewed and at this time Melvin Morrison it's in the left one back for change to see if we can make him more comfortable

## 2016-09-06 ENCOUNTER — Ambulatory Visit (INDEPENDENT_AMBULATORY_CARE_PROVIDER_SITE_OTHER): Payer: Self-pay | Admitting: Podiatry

## 2016-09-06 DIAGNOSIS — M722 Plantar fascial fibromatosis: Secondary | ICD-10-CM

## 2016-09-29 ENCOUNTER — Ambulatory Visit: Payer: 59

## 2016-09-29 DIAGNOSIS — M722 Plantar fascial fibromatosis: Secondary | ICD-10-CM

## 2016-09-29 NOTE — Progress Notes (Signed)
Pt orthotics are being sent back for the 2nd time for an adjustment. They were sent back originally to have the left lateral aspect lifted slightly because it was lower than current inserts and caused his foot to feel like it was rolling off the insert. Today inserts did not work due to significant increase in lateral aspect of insert. Pictures were taken to send to [facility]. Pt will need another appt for orthotic pick up when inserts arrive

## 2016-09-29 NOTE — Progress Notes (Signed)
Orthotics sent back for adjustment

## 2016-09-29 NOTE — Patient Instructions (Signed)

## 2016-11-17 ENCOUNTER — Ambulatory Visit (INDEPENDENT_AMBULATORY_CARE_PROVIDER_SITE_OTHER): Payer: 59

## 2016-11-17 DIAGNOSIS — M722 Plantar fascial fibromatosis: Secondary | ICD-10-CM

## 2016-11-17 NOTE — Patient Instructions (Signed)

## 2016-12-02 ENCOUNTER — Other Ambulatory Visit: Payer: Self-pay | Admitting: Family Medicine

## 2016-12-02 DIAGNOSIS — J4520 Mild intermittent asthma, uncomplicated: Secondary | ICD-10-CM

## 2016-12-20 NOTE — Progress Notes (Signed)
Patient presents for orthotic pick up.  Verbal and written break in and wear instructions given.  Patient will follow up in 4 weeks if symptoms worsen or fail to improve. 

## 2017-02-05 ENCOUNTER — Ambulatory Visit: Payer: 59 | Admitting: Family Medicine

## 2017-02-13 ENCOUNTER — Encounter: Payer: Self-pay | Admitting: Family Medicine

## 2017-02-13 ENCOUNTER — Ambulatory Visit (INDEPENDENT_AMBULATORY_CARE_PROVIDER_SITE_OTHER): Payer: 59 | Admitting: Family Medicine

## 2017-02-13 VITALS — BP 130/80 | HR 72 | Temp 97.9°F | Resp 20 | Ht 72.0 in | Wt 274.0 lb

## 2017-02-13 DIAGNOSIS — M5416 Radiculopathy, lumbar region: Secondary | ICD-10-CM

## 2017-02-13 MED ORDER — PREDNISONE 20 MG PO TABS: ORAL_TABLET | ORAL | 0 refills | Status: DC

## 2017-02-13 NOTE — Progress Notes (Signed)
Subjective:    Patient ID: Melvin Morrison, male    DOB: 1951-04-01, 66 y.o.   MRN: [id number]  HPI Patient reports 4-6 weeks of numbness radiating down the anterior portion of his left thigh from his belt line to his knee. Over the last few weeks he is developed a burning intense pain in the exact same location. Pain is made worse by prolonged standing and walking. It is alleviated by sitting. He denies any leg weakness. He denies any bowel or bladder incontinence. He denies any significant low back pain although he does have some lateral left hip pain. Past Medical History:  Diagnosis Date  . Asthma   . BPH (benign prostatic hyperplasia)   . Colon polyps   . DDD (degenerative disc disease), lumbar    L4-5  . GERD (gastroesophageal reflux disease)    No past surgical history on file. Current Outpatient Prescriptions on File Prior to Visit  Medication Sig Dispense Refill  . alfuzosin (UROXATRAL) 10 MG 24 hr tablet Take 10 mg by mouth daily.    Marland Kitchen esomeprazole (NEXIUM) 20 MG capsule Take 20 mg by mouth daily at 12 noon.    Marland Kitchen PROAIR HFA 108 (90 Base) MCG/ACT inhaler INHALE 2 PUFFS INTO THE LUNGS EVERY 6 HOURS AS NEEDED FOR WHEEZING OR SHORTNESS OF BREATH 8.5 Inhaler 3   No current facility-administered medications on file prior to visit.    No Known Allergies Social History   Social History  . Marital status: Single    Spouse name: N/A  . Number of children: N/A  . Years of education: N/A   Occupational History  . Not on file.   Social History Main Topics  . Smoking status: Never Smoker  . Smokeless tobacco: Never Used  . Alcohol use Yes     Comment: rare  . Drug use: No  . Sexual activity: Yes     Comment: married, Chartered certified accountant.   Other Topics Concern  . Not on file   Social History Narrative  . No narrative on file      Review of Systems  All other systems reviewed and are negative.      Objective:   Physical Exam  Constitutional: He appears well-developed  and well-nourished.  Neck: Neck supple. No thyromegaly present.  Cardiovascular: Normal rate, regular rhythm and normal heart sounds.   No murmur heard. Pulmonary/Chest: Effort normal. No respiratory distress. He has no wheezes. He has no rhonchi. He has no rales.  Abdominal: Soft. Bowel sounds are normal. He exhibits no distension. There is no tenderness. There is no rebound.  Musculoskeletal: He exhibits no edema.       Left hip: He exhibits tenderness.       Lumbar back: He exhibits normal range of motion and no tenderness.       Legs: Lymphadenopathy:    He has no cervical adenopathy.  Vitals reviewed.   Pain radiates in the area demarcated in blue on the diagram. Tender to palpation over the greater trochanter demarcated and red.  Reflexes are normal and strength is normal      Assessment & Plan:  Left lumbar radiculopathy - Plan: DG Lumbar Spine Complete, predniSONE (DELTASONE) 20 MG tablet  Differential diagnosis includes left lumbar radiculopathy, lateral femoral cutaneous nerve palsy, iliotibial band although this would be atypical area begin prednisone taper pack tomorrow I suspect is a bulging disc in his lower back attributed to his pain and obtain x-rays of the lumbar spine. Recheck in  2 weeks if no better or sooner if worse

## 2017-02-16 ENCOUNTER — Ambulatory Visit (HOSPITAL_COMMUNITY)
Admission: RE | Admit: 2017-02-16 | Discharge: 2017-02-16 | Disposition: A | Discharge status: Home / Self Care | Payer: 59 | Source: Ambulatory Visit | Attending: Family Medicine | Admitting: Family Medicine

## 2017-02-16 DIAGNOSIS — I700 Atherosclerosis of aorta: Secondary | ICD-10-CM | POA: Insufficient documentation

## 2017-02-16 DIAGNOSIS — M47896 Other spondylosis, lumbar region: Secondary | ICD-10-CM | POA: Diagnosis not present

## 2017-02-16 DIAGNOSIS — M5416 Radiculopathy, lumbar region: Secondary | ICD-10-CM | POA: Diagnosis present

## 2017-07-06 IMAGING — DX DG LUMBAR SPINE COMPLETE 4+V
5 series · 5 of 5 positions shown · non-contrast
Comparison: CT scan of June 01, 2014.

CLINICAL DATA: Low back and left leg pain for 2 months without
known injury.

EXAM:
LUMBAR SPINE - COMPLETE 4+ VIEW

[l-spine ap]
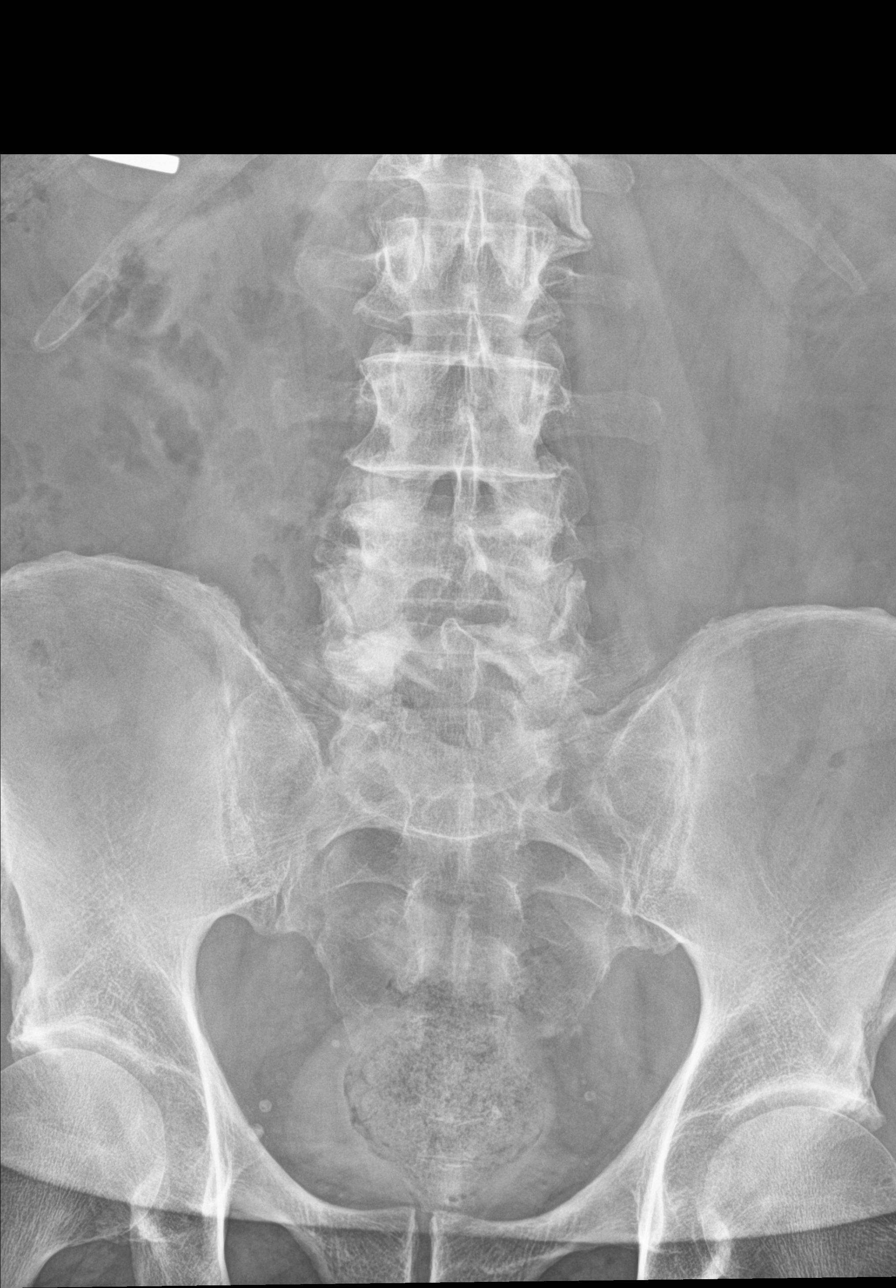

[l-spine obl (1 of 2)]
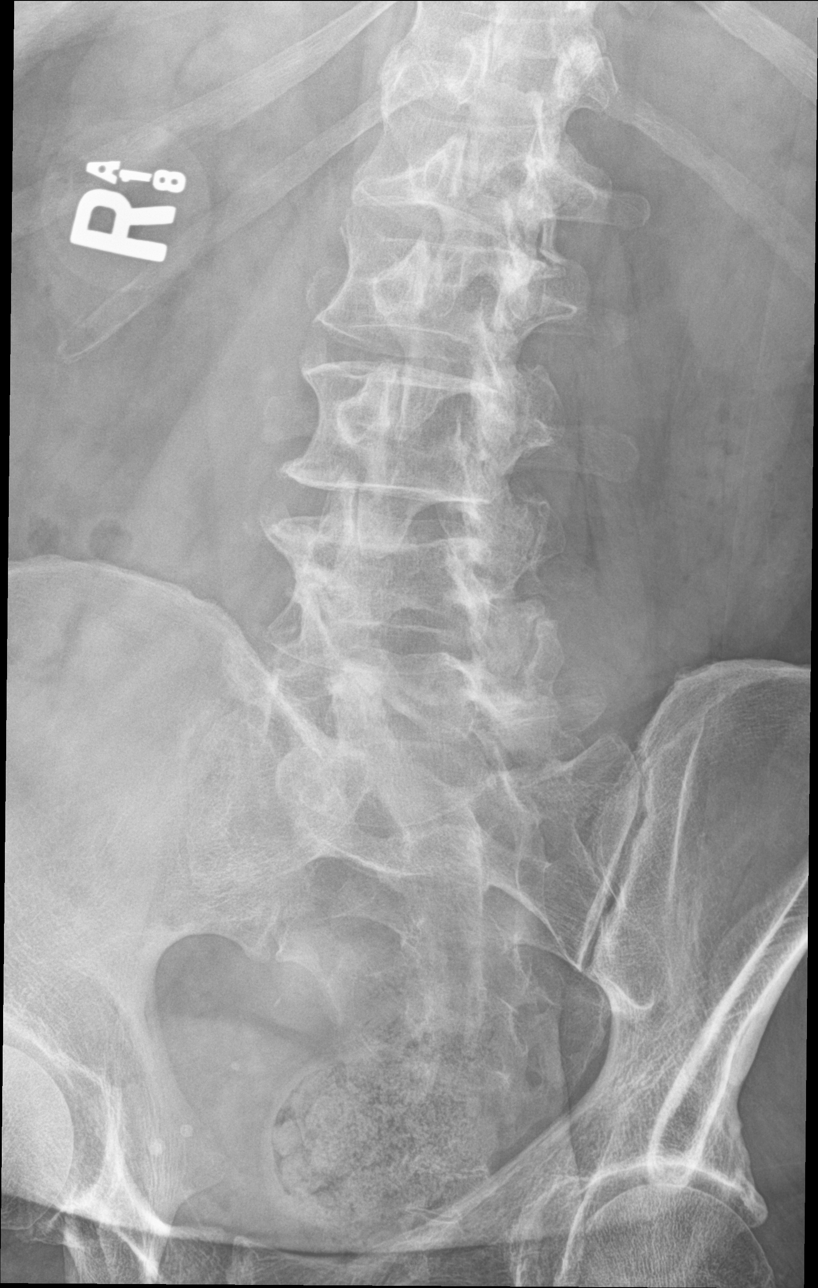

[l-spine obl (2 of 2)]
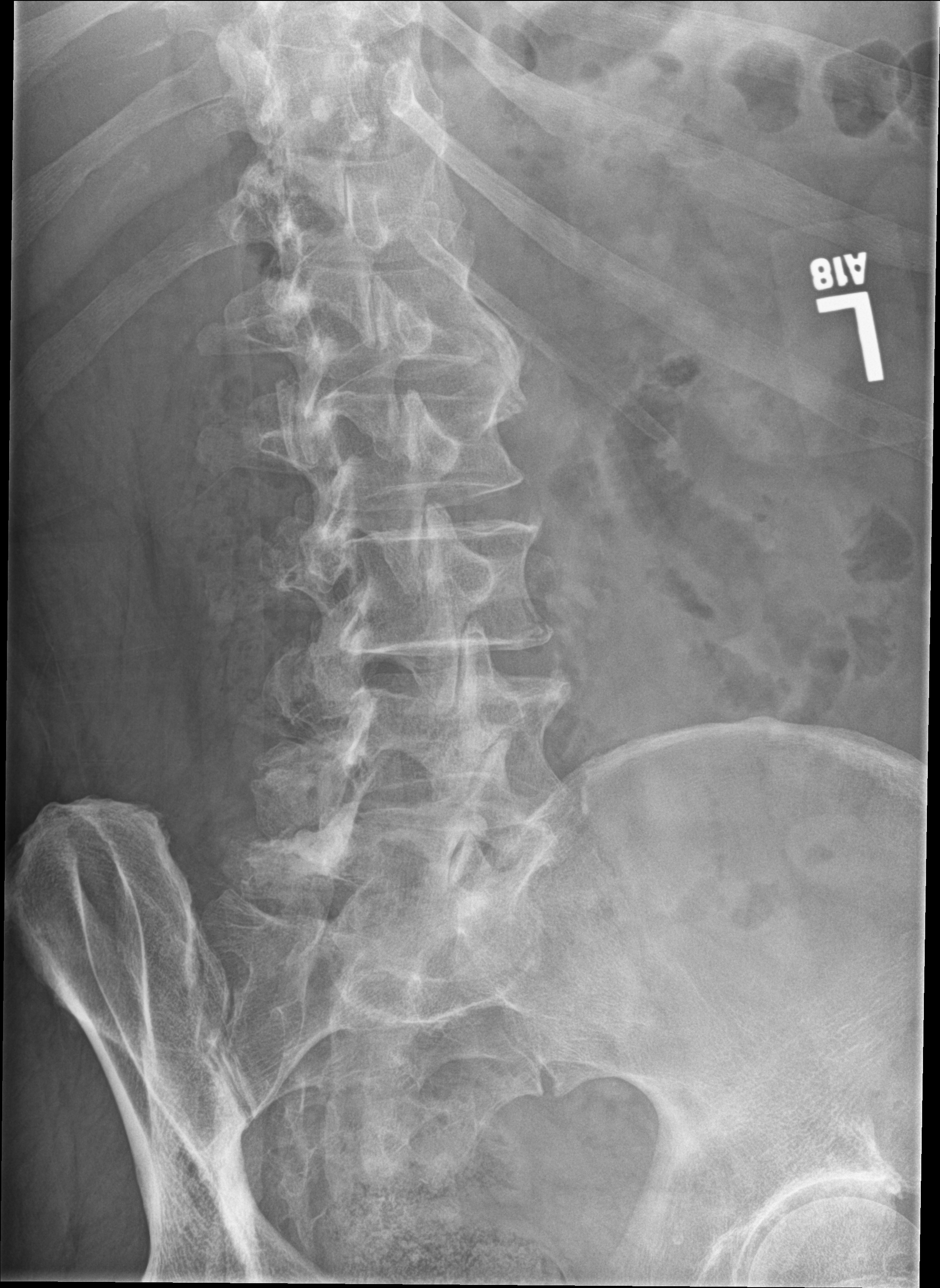

[l-spine lat]
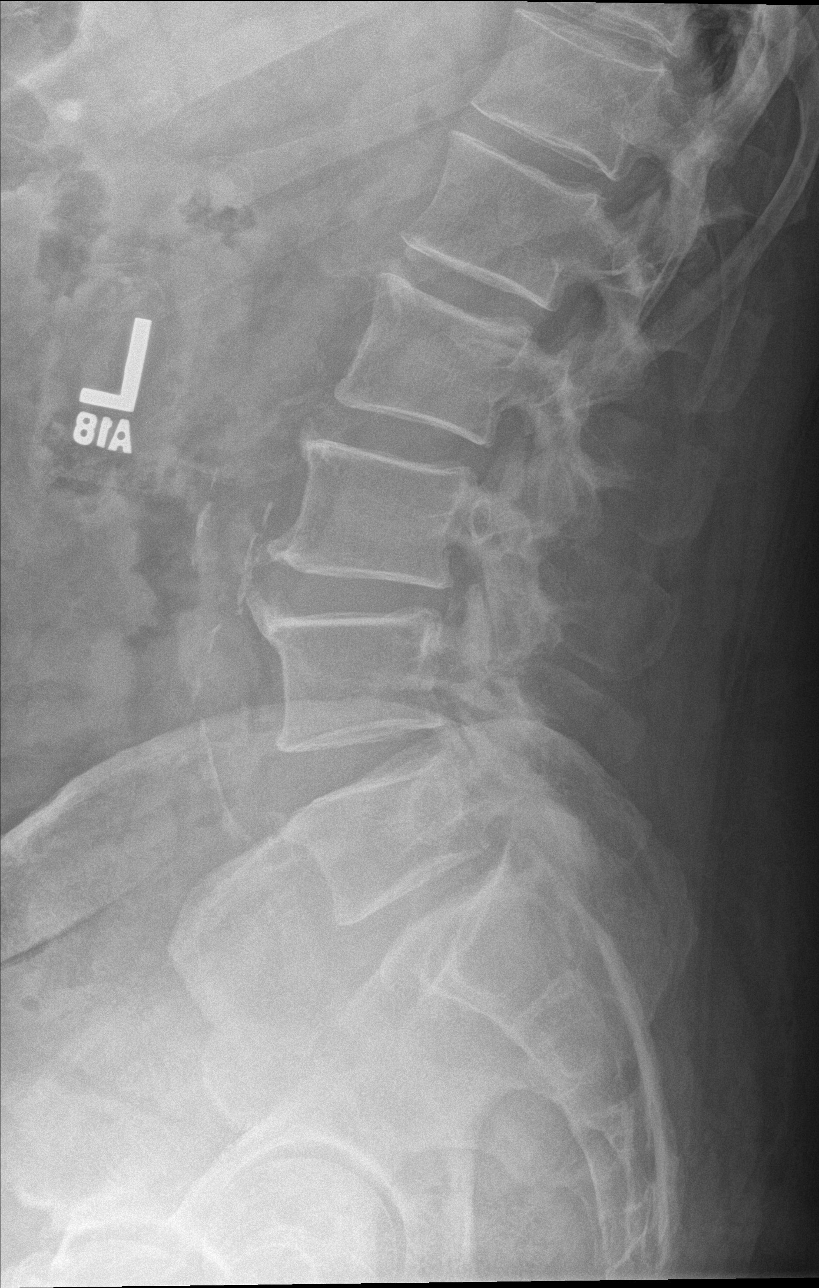

[l-spine spot]
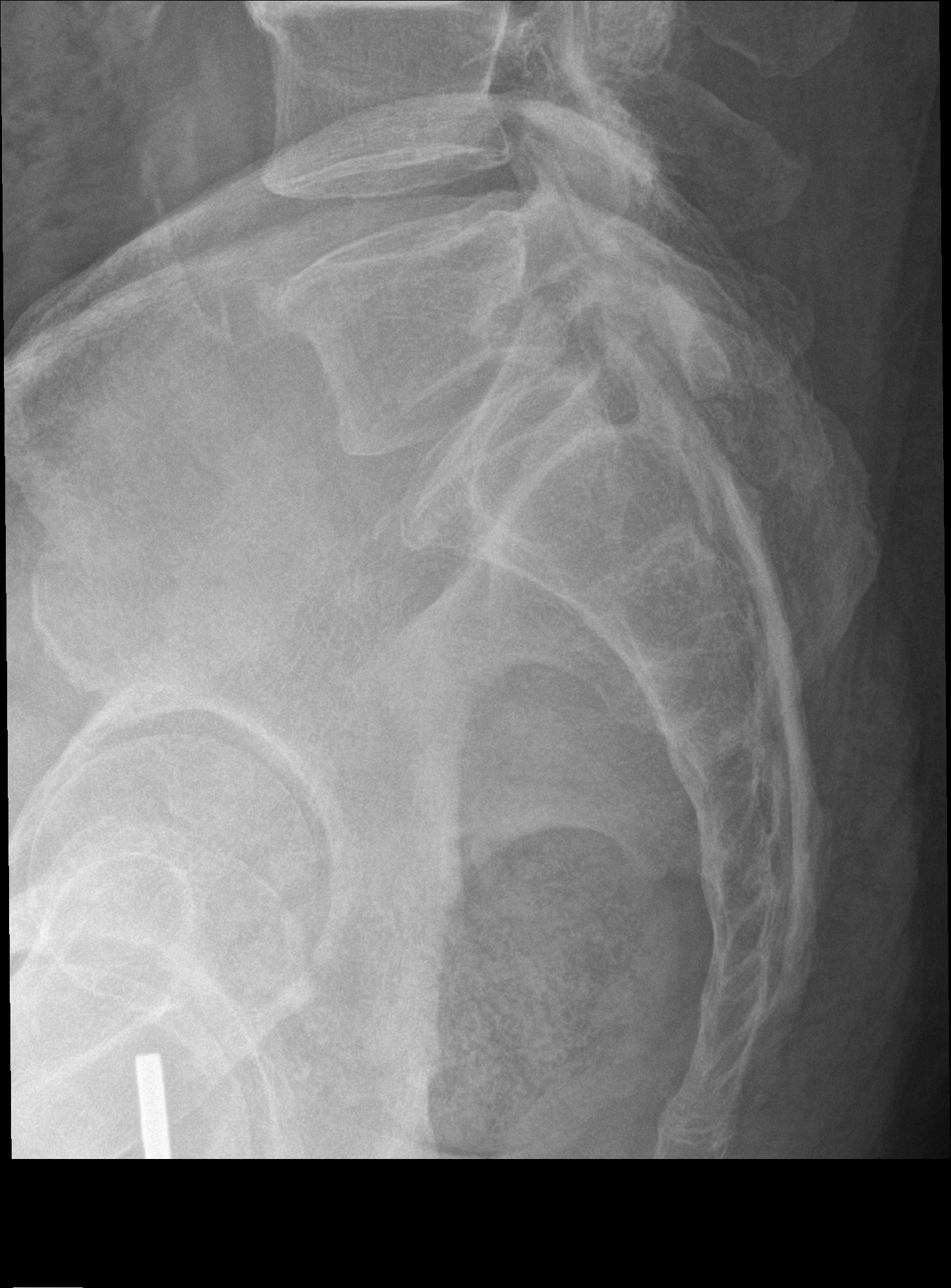

[5 of 5 positions shown; findings below may reference images not displayed]

FINDINGS: No fracture or spondylolisthesis is noted. Disc spaces are
well-maintained. Mild anterior osteophyte formation is noted at L2-3
and L3-4. Atherosclerosis of abdominal aorta is noted. Degenerative
changes seen involving posterior facet joints of L4-5 and L5-S1.
IMPRESSION: Mild degenerative changes as described above. Aortic
atherosclerosis. No acute abnormality seen in the lumbar spine.

## 2017-08-22 ENCOUNTER — Encounter: Payer: Self-pay | Admitting: Podiatry

## 2017-08-22 ENCOUNTER — Ambulatory Visit (INDEPENDENT_AMBULATORY_CARE_PROVIDER_SITE_OTHER): Payer: 59 | Admitting: Podiatry

## 2017-08-22 DIAGNOSIS — L600 Ingrowing nail: Secondary | ICD-10-CM | POA: Diagnosis not present

## 2017-08-22 DIAGNOSIS — M722 Plantar fascial fibromatosis: Secondary | ICD-10-CM

## 2017-08-22 NOTE — Patient Instructions (Signed)

## 2017-08-26 NOTE — Progress Notes (Signed)
   Subjective: Patient presents today for pain and tenderness in the feet bilaterally. Patient states the foot pain has been present for several weeks now. Patient states that it hurts in the morning with the first steps out of bed.  Patient requests new orthotics. He also has a new complaint of pain to the medial border of the right great toe that has been present for the past 2-3 weeks.  Applying pressure to the area increases the pain.  There are no alleviating factors noted.  He has not done anything to treat the symptoms.  Patient presents today for further treatment and evaluation.   Past Medical History:  Diagnosis Date  . Asthma   . BPH (benign prostatic hyperplasia)   . Colon polyps   . DDD (degenerative disc disease), lumbar    L4-5  . GERD (gastroesophageal reflux disease)      Objective: Physical Exam General: The patient is alert and oriented x3 in no acute distress.  Dermatology:  Skin is warm, dry and supple bilateral.  Medial border of the right great toe appears to be erythematous with evidence of an ingrowing nail. Pain on palpation noted to the border of the nail fold. The remaining nails appear unremarkable at this time. There are no open sores, lesions.  Vascular: Dorsalis Pedis and Posterior Tibial pulses palpable bilateral.  Capillary fill time is immediate to all digits.  Neurological: Epicritic and protective threshold intact bilateral.   Musculoskeletal: Tenderness to palpation at the medial calcaneal tubercale and through the insertion of the plantar fascia of the bilateral feet. All other joints range of motion within normal limits bilateral. Strength 5/5 in all groups bilateral.    Assessment: 1. plantar fasciitis bilateral feet 2.  Paronychia with ingrowing nail to the medial border of the right great toe 3.  Incurvated nail of the right great toe  Plan of Care:  1. Patient evaluated.  2. Discussed treatment alternatives and plan of care. Explained  nail avulsion procedure and post procedure course to patient. 3. Patient opted for permanent partial nail avulsion.  4. Prior to procedure, local anesthesia infiltration utilized using 3 ml of a 50:50 mixture of 2% plain lidocaine and 0.5% plain marcaine in a normal hallux block fashion and a betadine prep performed.  5. Partial permanent nail avulsion with chemical matrixectomy performed using 3x30sec applications of phenol followed by alcohol flush.  6. Light dressing applied. 7.  Scanned today for custom molded orthotics. 8.  Return to clinic in 3 weeks.     Felecia ShellingBrent M. Rudean Icenhour, DPM [facility]  Dr. Felecia ShellingBrent M. Maureena Dabbs, DPM    [address]                                   [place], [place] [address]                Office [phone]  Fax [phone]

## 2017-09-19 ENCOUNTER — Encounter: Payer: Self-pay | Admitting: Podiatry

## 2017-09-19 ENCOUNTER — Ambulatory Visit: Payer: 59 | Admitting: Podiatry

## 2017-09-19 DIAGNOSIS — M722 Plantar fascial fibromatosis: Secondary | ICD-10-CM

## 2017-09-25 NOTE — Progress Notes (Signed)
Pt picked up orthotics today. States that the inserts feel good. Care and break-in instructions were provided. Pt told to contact the office if any issues arise.

## 2018-02-04 ENCOUNTER — Encounter: Payer: Self-pay | Admitting: Podiatry

## 2018-02-04 ENCOUNTER — Ambulatory Visit (INDEPENDENT_AMBULATORY_CARE_PROVIDER_SITE_OTHER): Payer: 59 | Admitting: Podiatry

## 2018-02-04 DIAGNOSIS — L600 Ingrowing nail: Secondary | ICD-10-CM | POA: Diagnosis not present

## 2018-02-04 NOTE — Patient Instructions (Signed)

## 2018-02-07 NOTE — Progress Notes (Signed)
   Subjective: Patient presents today for evaluation of pain to the medial border of the left hallux that began 4 months ago. Patient is concerned for possible ingrown nail. Applying pressure to the area increases the pain. He has not done anything for treatment. Patient is here for further evaluation and treatment.   Past Medical History:  Diagnosis Date  . Asthma   . BPH (benign prostatic hyperplasia)   . Colon polyps   . DDD (degenerative disc disease), lumbar    L4-5  . GERD (gastroesophageal reflux disease)     Objective:  General: Well developed, nourished, in no acute distress, alert and oriented x3   Dermatology: Skin is warm, dry and supple bilateral. Medial border of the left hallux appears to be erythematous with evidence of an ingrowing nail. Pain on palpation noted to the border of the nail fold. The remaining nails appear unremarkable at this time. There are no open sores, lesions.  Vascular: Dorsalis Pedis artery and Posterior Tibial artery pedal pulses palpable. No lower extremity edema noted.   Neruologic: Grossly intact via light touch bilateral.  Musculoskeletal: Muscular strength within normal limits in all groups bilateral. Normal range of motion noted to all pedal and ankle joints.   Assesement: #1 Paronychia with ingrowing nail medial border of the left hallux #2 Pain in toe #3 Incurvated nail  Plan of Care:  1. Patient evaluated.  2. Discussed treatment alternatives and plan of care. Explained nail avulsion procedure and post procedure course to patient. 3. Patient opted for permanent partial nail avulsion.  4. Prior to procedure, local anesthesia infiltration utilized using 3 ml of a 50:50 mixture of 2% plain lidocaine and 0.5% plain marcaine in a normal hallux block fashion and a betadine prep performed.  5. Partial permanent nail avulsion with chemical matrixectomy performed using 3x30sec applications of phenol followed by alcohol flush.  6. Light  dressing applied. 7. Return to clinic in 2 weeks.   Felecia Shelling, DPM [facility]  Dr. Felecia Shelling, DPM    [address]                                        [place], [place] [address]                Office [phone]  Fax [phone]

## 2018-02-18 ENCOUNTER — Encounter: Payer: Self-pay | Admitting: Podiatry

## 2018-02-18 ENCOUNTER — Ambulatory Visit (INDEPENDENT_AMBULATORY_CARE_PROVIDER_SITE_OTHER): Payer: 59 | Admitting: Podiatry

## 2018-02-18 DIAGNOSIS — L600 Ingrowing nail: Secondary | ICD-10-CM

## 2018-02-21 NOTE — Progress Notes (Signed)
   Subjective: Patient presents today 2 weeks post ingrown nail permanent nail avulsion procedure of the lateral border of the left hallux. He reports some redness but denies any pain. He has been soaking the foot for treatment. Patient is here for further evaluation and treatment.   Past Medical History:  Diagnosis Date  . Asthma   . BPH (benign prostatic hyperplasia)   . Colon polyps   . DDD (degenerative disc disease), lumbar    L4-5  . GERD (gastroesophageal reflux disease)     Objective: Skin is warm, dry and supple. Nail and respective nail fold appears to be healing appropriately. Open wound to the associated nail fold with a granular wound base and moderate amount of fibrotic tissue. Minimal drainage noted. Mild erythema around the periungual region likely due to phenol chemical matricectomy.  Assessment: #1 postop permanent partial nail avulsion lateral border of the left hallux  #2 open wound periungual nail fold of respective digit.   Plan of care: #1 patient was evaluated  #2 debridement of open wound was performed to the periungual border of the respective toe using a currette. Antibiotic ointment and Band-Aid was applied. #3 patient is to return to clinic on a PRN basis.   Davie Sagona M. Averie Meiner, DPM [facility]  Dr. Felecia ShellingBrent M. Felecia ShellingEvans, DPM    [address]                                        [place], [place] [address]                Office [phone]  Fax [phone]

## 2018-02-26 ENCOUNTER — Encounter: Payer: Self-pay | Admitting: Family Medicine

## 2018-02-26 ENCOUNTER — Ambulatory Visit (INDEPENDENT_AMBULATORY_CARE_PROVIDER_SITE_OTHER): Payer: 59 | Admitting: Family Medicine

## 2018-02-26 VITALS — BP 140/76 | HR 95 | Temp 98.5°F | Resp 18 | Ht 72.0 in | Wt 284.0 lb

## 2018-02-26 DIAGNOSIS — R002 Palpitations: Secondary | ICD-10-CM | POA: Diagnosis not present

## 2018-02-26 DIAGNOSIS — R0683 Snoring: Secondary | ICD-10-CM

## 2018-02-26 DIAGNOSIS — R0681 Apnea, not elsewhere classified: Secondary | ICD-10-CM

## 2018-02-26 DIAGNOSIS — Z0000 Encounter for general adult medical examination without abnormal findings: Secondary | ICD-10-CM

## 2018-02-26 DIAGNOSIS — Z23 Encounter for immunization: Secondary | ICD-10-CM | POA: Diagnosis not present

## 2018-02-26 LAB — COMPLETE METABOLIC PANEL WITH GFR
AG RATIO: 1.8 (calc) (ref 1.0–2.5)
ALBUMIN MSPROF: 4.2 g/dL (ref 3.6–5.1)
ALKALINE PHOSPHATASE (APISO): 69 U/L (ref 40–115)
ALT: 31 U/L (ref 9–46)
AST: 20 U/L (ref 10–35)
BILIRUBIN TOTAL: 1.3 mg/dL — AB (ref 0.2–1.2)
BUN: 15 mg/dL (ref 7–25)
CHLORIDE: 106 mmol/L (ref 98–110)
CO2: 27 mmol/L (ref 20–32)
Calcium: 9.3 mg/dL (ref 8.6–10.3)
Creat: 1.03 mg/dL (ref 0.70–1.25)
GFR, EST AFRICAN AMERICAN: 88 mL/min/{1.73_m2} (ref >=60)
GFR, Est Non African American: 76 mL/min/{1.73_m2} (ref >=60)
GLUCOSE: 113 mg/dL — AB (ref 65–99)
Globulin: 2.4 g/dL (calc) (ref 1.9–3.7)
POTASSIUM: 4.3 mmol/L (ref 3.5–5.3)
Sodium: 141 mmol/L (ref 135–146)
TOTAL PROTEIN: 6.6 g/dL (ref 6.1–8.1)

## 2018-02-26 LAB — CBC WITH DIFFERENTIAL/PLATELET
BASOS ABS: 61 {cells}/uL (ref 0–200)
BASOS PCT: 1 %
EOS ABS: 98 {cells}/uL (ref 15–500)
Eosinophils Relative: 1.6 %
HCT: 46.2 % (ref 38.5–50.0)
HEMOGLOBIN: 16.1 g/dL (ref 13.2–17.1)
Lymphs Abs: 1824 cells/uL (ref 850–3900)
MCH: 30.1 pg (ref 27.0–33.0)
MCHC: 34.8 g/dL (ref 32.0–36.0)
MCV: 86.4 fL (ref 80.0–100.0)
MONOS PCT: 10.6 %
MPV: 9.5 fL (ref 7.5–12.5)
Neutro Abs: 3471 cells/uL (ref 1500–7800)
Neutrophils Relative %: 56.9 %
PLATELETS: 267 10*3/uL (ref 140–400)
RBC: 5.35 10*6/uL (ref 4.20–5.80)
RDW: 12.6 % (ref 11.0–15.0)
TOTAL LYMPHOCYTE: 29.9 %
WBC: 6.1 10*3/uL (ref 3.8–10.8)
WBCMIX: 647 {cells}/uL (ref 200–950)

## 2018-02-26 LAB — LIPID PANEL
CHOLESTEROL: 175 mg/dL (ref <200)
HDL: 39 mg/dL — ABNORMAL LOW (ref >40)
LDL Cholesterol (Calc): 117 mg/dL (calc) — ABNORMAL HIGH
Non-HDL Cholesterol (Calc): 136 mg/dL (calc) — ABNORMAL HIGH (ref <130)
Total CHOL/HDL Ratio: 4.5 (calc) (ref <5.0)
Triglycerides: 93 mg/dL (ref <150)

## 2018-02-26 LAB — TSH: TSH: 2.1 m[IU]/L (ref 0.40–4.50)

## 2018-02-26 MED ORDER — ZOSTER VAC RECOMB ADJUVANTED 50 MCG/0.5ML IM SUSR: 0.5000 mL | Freq: Once | INTRAMUSCULAR | 1 refills | Status: AC

## 2018-02-26 NOTE — Progress Notes (Signed)
Subjective:    Patient ID: Melvin Morrison, male    DOB: 10/29/1950, 67 y.o.   MRN: [id number]  HPI Patient is here today for complete physical exam.  He has a few concerns today.  First he was diagnosed with obstructive sleep apnea more than 30 years ago and outside clinic.  I have no records of this to review however he has been wearing his CPAP machine over the last 30 years.  He is requesting a new machine and new equipment.  Unfortunately I have no documentation to provide to his insurance company to have this covered.  He denies any hypersomnolence.  As long as he is compliant with the receiving, the patient states that he feels fine.  He does complain of a cramp-like pain underneath his right and left ribs sometimes that occurs when he is twisting or moving.  The pain will be intense and sharp similar to a cramp.  It will last 5 to 10 seconds and then relax if he moves or stretches out the muscle.  It certainly sounds like a cramp in the oblique muscle.  This occurs infrequently in his bilateral.  He also reports a vibration-like sensation in his left chest.  He describes it is feeling his cell phone ringing or vibrating in his front pocket.  It seems very superficial.  He denies any chest pain shortness of breath or dyspnea on exertion.  He denies any syncope.  On exam today, he is in regular rate and rhythm.  EKG shows normal sinus rhythm with normal intervals and a normal axis with no evidence of ischemia or infarction.  He denies any fluttering in his chest.  He states that this feels more superficial.  However he denies any muscle fasciculations which is what I believe the patient is feeling Immunization History  Administered Date(s) Administered  . Influenza,inj,Quad PF,6+ Mos 06/14/2017  . Influenza-Unspecified 06/29/2015, 07/06/2016  . Pneumococcal Polysaccharide-23 12/25/2013, 02/26/2018  . Tdap 02/26/2018    Past Medical History:  Diagnosis Date  . Asthma   . BPH (benign  prostatic hyperplasia)   . Colon polyps   . DDD (degenerative disc disease), lumbar    L4-5  . GERD (gastroesophageal reflux disease)   . Sleep apnea    No past surgical history on file. Current Outpatient Medications on File Prior to Visit  Medication Sig Dispense Refill  . alfuzosin (UROXATRAL) 10 MG 24 hr tablet Take 10 mg by mouth daily.    Marland Kitchen esomeprazole (NEXIUM) 20 MG capsule Take 20 mg by mouth daily at 12 noon.    Marland Kitchen PROAIR HFA 108 (90 Base) MCG/ACT inhaler INHALE 2 PUFFS INTO THE LUNGS EVERY 6 HOURS AS NEEDED FOR WHEEZING OR SHORTNESS OF BREATH 8.5 Inhaler 3  . anastrozole (ARIMIDEX) 1 MG tablet Take 1 mg by mouth daily.  3   No current facility-administered medications on file prior to visit.    No Known Allergies Social History   Socioeconomic History  . Marital status: Single    Spouse name: Not on file  . Number of children: Not on file  . Years of education: Not on file  . Highest education level: Not on file  Occupational History  . Not on file  Social Needs  . Financial resource strain: Not on file  . Food insecurity:    Worry: Not on file    Inability: Not on file  . Transportation needs:    Medical: Not on file    Non-medical: Not  on file  Tobacco Use  . Smoking status: Never Smoker  . Smokeless tobacco: Never Used  Substance and Sexual Activity  . Alcohol use: Yes    Comment: rare  . Drug use: No  . Sexual activity: Yes    Comment: married, Chartered certified accountant.  Lifestyle  . Physical activity:    Days per week: Not on file    Minutes per session: Not on file  . Stress: Not on file  Relationships  . Social connections:    Talks on phone: Not on file    Gets together: Not on file    Attends religious service: Not on file    Active member of club or organization: Not on file    Attends meetings of clubs or organizations: Not on file    Relationship status: Not on file  . Intimate partner violence:    Fear of current or ex partner: Not on file     Emotionally abused: Not on file    Physically abused: Not on file    Forced sexual activity: Not on file  Other Topics Concern  . Not on file  Social History Narrative  . Not on file   Family History  Problem Relation Age of Onset  . Diabetes Mother   . Diabetes Maternal Grandmother        Review of Systems  All other systems reviewed and are negative.      Objective:   Physical Exam  Constitutional: He is oriented to person, place, and time. He appears well-developed and well-nourished. No distress.  HENT:  Head: Normocephalic and atraumatic.  Right Ear: External ear normal.  Left Ear: External ear normal.  Nose: Nose normal.  Mouth/Throat: Oropharynx is clear and moist. No oropharyngeal exudate.  Eyes: Pupils are equal, round, and reactive to light. Conjunctivae and EOM are normal. Right eye exhibits no discharge. Left eye exhibits no discharge. No scleral icterus.  Neck: Normal range of motion. Neck supple. No JVD present. No tracheal deviation present. No thyromegaly present.  Cardiovascular: Normal rate, regular rhythm, normal heart sounds and intact distal pulses. Exam reveals no gallop and no friction rub.  No murmur heard. Pulmonary/Chest: Effort normal and breath sounds normal. No stridor. No respiratory distress. He has no wheezes. He has no rales. He exhibits no tenderness.  Abdominal: Soft. Bowel sounds are normal. He exhibits no distension and no mass. There is no tenderness. There is no rebound and no guarding.  Musculoskeletal: Normal range of motion. He exhibits no edema or tenderness.  Lymphadenopathy:    He has no cervical adenopathy.  Neurological: He is alert and oriented to person, place, and time. He has normal reflexes. No cranial nerve deficit. He exhibits normal muscle tone. Coordination normal.  Skin: Skin is warm. No rash noted. He is not diaphoretic. No erythema. No pallor.  Psychiatric: He has a normal mood and affect. His behavior is normal.  Judgment and thought content normal.  Vitals reviewed.         Assessment & Plan:  Palpitations - Plan: EKG 12-Lead, CBC with Differential/Platelet, COMPLETE METABOLIC PANEL WITH GFR, Lipid panel, TSH  General medical exam - Plan: CBC with Differential/Platelet, COMPLETE METABOLIC PANEL WITH GFR, Lipid panel, TSH  Loud snoring - Plan: Ambulatory referral to Sleep Studies  Episode of apnea - Plan: Ambulatory referral to Sleep Studies  Need for Tdap vaccination - Plan: Tdap vaccine greater than or equal to 7yo IM  Need for prophylactic vaccination against Streptococcus pneumoniae (pneumococcus) -  Plan: Pneumococcal polysaccharide vaccine 23-valent greater than or equal to 2yo subcutaneous/IM Physical exam today is normal. Recommended diet exercise and weight loss. Colonoscopy is up-to-date. Last colonoscopy was 2011. He gets his prostate exam at his urologist along with his PSA every August.  Immunizations are up-to-date including the shingles vaccine.  He received Pneumovax 23 today along with his tetanus shot.  I will check a CBC, CMP, fasting lipid panel. He declines hepatitis C screening and HIV screening.  Per the patient request, I will refer him to a sleep specialist to repeat a sleep study so that he can get updated equipment.  I believe the sensation in his left chest is likely a muscle fasciculation in the pectoralis muscle.  I do not believe it is due to a serious underlying medical problem.  Certainly he can recheck immediately if symptoms change or worsen in any way.  I believe the cramp-like pain in his abdomen is just that, a cramp in his oblique muscle.  Reassurance was provided

## 2018-02-26 NOTE — Progress Notes (Signed)
referr

## 2018-03-01 ENCOUNTER — Encounter: Payer: Self-pay | Admitting: Family Medicine

## 2018-03-01 ENCOUNTER — Telehealth: Payer: Self-pay | Admitting: Family Medicine

## 2018-03-01 DIAGNOSIS — J4520 Mild intermittent asthma, uncomplicated: Secondary | ICD-10-CM

## 2018-03-01 MED ORDER — ALBUTEROL SULFATE HFA 108 (90 BASE) MCG/ACT IN AERS: INHALATION_SPRAY | RESPIRATORY_TRACT | 3 refills | Status: DC

## 2018-03-01 NOTE — Telephone Encounter (Signed)
Medication called/sent to requested pharmacy  

## 2018-03-01 NOTE — Telephone Encounter (Signed)
Refill on [facility].

## 2018-03-06 ENCOUNTER — Other Ambulatory Visit: Payer: Self-pay | Admitting: Family Medicine

## 2018-03-06 DIAGNOSIS — R0683 Snoring: Secondary | ICD-10-CM

## 2018-03-06 DIAGNOSIS — R0681 Apnea, not elsewhere classified: Secondary | ICD-10-CM

## 2018-03-22 ENCOUNTER — Telehealth: Payer: Self-pay | Admitting: Family Medicine

## 2018-03-22 NOTE — Telephone Encounter (Signed)
Patient called in asking if it is ok for him to get the shingrix shot. Please advise?

## 2018-03-22 NOTE — Telephone Encounter (Signed)
Yes, it would be fine.  Where does he want us to order ([facility]?)

## 2018-03-25 NOTE — Telephone Encounter (Signed)
Patients wife Rosey Batheresa  called back and states that patient doesn't have Medicare and the insurance she has will pay 100% as long as the immunization is given at the doctors office. I advised her that I would have to check to make sure we had an extra shingrix shot available for patient and call her back. She requested I call her back after lunch.  Dr. Tanya NonesPickard, we do have extra shots. I have verified that he doesn't have medicare. Please advise if I can schedule appointment.

## 2018-03-25 NOTE — Telephone Encounter (Signed)
I am fine with him receiving the shot here if we have one to give him.  Please schedule if we do.

## 2018-03-25 NOTE — Telephone Encounter (Signed)
Spoke with patient's wife and got patient scheduled to get Shingrix on tomorrow 03/26/18 at 0815.

## 2018-03-26 ENCOUNTER — Ambulatory Visit: Payer: 59 | Attending: Family Medicine | Admitting: Neurology

## 2018-03-26 ENCOUNTER — Ambulatory Visit (INDEPENDENT_AMBULATORY_CARE_PROVIDER_SITE_OTHER): Payer: 59

## 2018-03-26 DIAGNOSIS — R0683 Snoring: Secondary | ICD-10-CM | POA: Insufficient documentation

## 2018-03-26 DIAGNOSIS — R0681 Apnea, not elsewhere classified: Secondary | ICD-10-CM

## 2018-03-26 DIAGNOSIS — Z23 Encounter for immunization: Secondary | ICD-10-CM

## 2018-03-26 DIAGNOSIS — G4733 Obstructive sleep apnea (adult) (pediatric): Secondary | ICD-10-CM | POA: Diagnosis not present

## 2018-03-26 NOTE — Progress Notes (Signed)
Patient came in today to get first shingrix vaccine. Shingrix was given in the left deltoid. Patient tolerated well. Vaccine information sheet given to patient.

## 2018-04-03 ENCOUNTER — Telehealth: Payer: Self-pay | Admitting: Family Medicine

## 2018-04-03 NOTE — Telephone Encounter (Signed)
Pt called and once we receive sleep study results pt would like a rx for the CPAP and supplies as he would like to buy online as opposed to going to home health agency. Will be happy to do once report is received.

## 2018-04-06 NOTE — Procedures (Signed)
Willis A. Merlene Laughter, MD     [email or web address]             NOCTURNAL POLYSOMNOGRAPHY   LOCATION: [facility]  Patient Name: Melvin Morrison, Melvin Morrison Date: 03/26/2018 Gender: Male D.O.B: 04-May-1951 Age (years): 39 Referring Provider: Cletus Gash T. Pickard Height (inches): 72 Interpreting Physician: Phillips Odor MD, ABSM Weight (lbs): 284 RPSGT: Rosebud Poles BMI: 39 MRN: [id number] Neck Size: 19.00 CLINICAL INFORMATION Sleep Study Type: Split Night CPAP     Indication for sleep study: Snoring     Epworth Sleepiness Score: 4  SLEEP STUDY TECHNIQUE As per the AASM Manual for the Scoring of Sleep and Associated Events v2.3 (April 2016) with a hypopnea requiring 4% desaturations.  The channels recorded and monitored were frontal, central and occipital EEG, electrooculogram (EOG), submentalis EMG (chin), nasal and oral airflow, thoracic and abdominal wall motion, anterior tibialis EMG, snore microphone, electrocardiogram, and pulse oximetry. Continuous positive airway pressure (CPAP) was initiated when the patient met split night criteria and was titrated according to treat sleep-disordered breathing.  MEDICATIONS Medications self-administered by patient taken the night of the study : N/A  Current Outpatient Medications:  .  albuterol (PROAIR HFA) 108 (90 Base) MCG/ACT inhaler, INHALE 2 PUFFS INTO THE LUNGS EVERY 6 HOURS AS NEEDED FOR WHEEZING OR SHORTNESS OF BREATH, Disp: 8.5 Inhaler, Rfl: 3 .  alfuzosin (UROXATRAL) 10 MG 24 hr tablet, Take 10 mg by mouth daily., Disp: , Rfl:  .  anastrozole (ARIMIDEX) 1 MG tablet, Take 1 mg by mouth daily., Disp: , Rfl: 3 .  esomeprazole (NEXIUM) 20 MG capsule, Take 20 mg by mouth daily at 12 noon., Disp: , Rfl:    RESPIRATORY PARAMETERS Diagnostic  Total AHI (/hr): 36.8 RDI (/hr): 65.4 OA Index (/hr): 5.4 CA Index (/hr): 0.4 REM AHI (/hr): N/A NREM AHI (/hr): 36.8 Supine AHI (/hr): 44.0 Non-supine AHI  (/hr): 33.95 Min O2 Sat (%): 86.0 Mean O2 (%): 93.2 Time below 88% (min): 1.4   Titration  Optimal Pressure (cm): 8 AHI at Optimal Pressure (/hr): 0.0 Min O2 at Optimal Pressure (%): 87.0 Supine % at Optimal (%): 100 Sleep % at Optimal (%): 100   SLEEP ARCHITECTURE The recording time for the entire night was 401.1 minutes.  During a baseline period of 219.5 minutes, the patient slept for 155.0 minutes in REM and nonREM, yielding a sleep efficiency of 70.6%%. Sleep onset after lights out was 15.8 minutes with a REM latency of N/A minutes. The patient spent 14.8%% of the night in stage N1 sleep, 85.2%% in stage N2 sleep, 0.0%% in stage N3 and 0.0%% in REM.     During the titration period of 175.0 minutes, the patient slept for 101.3 minutes in REM and nonREM, yielding a sleep efficiency of 57.9%%. Sleep onset after CPAP initiation was 57.3 minutes with a REM latency of 94.0 minutes. The patient spent 6.4%% of the night in stage N1 sleep, 31.4%% in stage N2 sleep, 40.5%% in stage N3 and 21.7%% in REM.  CARDIAC DATA The 2 lead EKG demonstrated sinus rhythm. The mean heart rate was 100.0 beats per minute. Other EKG findings include: None. LEG MOVEMENT DATA The total Periodic Limb Movements of Sleep (PLMS) were 0. The PLMS index was 0.0.  IMPRESSIONS Severe obstructive sleep apnea occurred during the diagnostic portion of the study (AHI = 36.8/hour). The optimal CPAP selected for this patient is ( 8 cm of water) No significant central sleep apnea occurred during the diagnostic portion of the study (  CAI = 0.4/hour).   [name], MD Diplomate, American Board of Sleep Medicine.  ELECTRONICALLY SIGNED ON:  04/06/2018, 8:51 PM [place] PH: [phone]   FX: [phone] [place]

## 2018-04-08 ENCOUNTER — Encounter: Payer: Self-pay | Admitting: Family Medicine

## 2018-04-08 DIAGNOSIS — G4730 Sleep apnea, unspecified: Secondary | ICD-10-CM | POA: Insufficient documentation

## 2018-04-08 NOTE — Telephone Encounter (Signed)
Not sure what we need to do.  Report is in computer:  Severe OSA, AHI 36/hr  Needs CPAP at 8 cm H2O

## 2018-04-09 NOTE — Telephone Encounter (Signed)
Rx for cpap and copy of sleep study left up front and pt's wife aware to p/u

## 2018-05-28 ENCOUNTER — Ambulatory Visit (INDEPENDENT_AMBULATORY_CARE_PROVIDER_SITE_OTHER): Payer: Medicare Other

## 2018-05-28 DIAGNOSIS — Z23 Encounter for immunization: Secondary | ICD-10-CM | POA: Diagnosis not present

## 2018-05-28 NOTE — Progress Notes (Signed)
Patient came in today to get the 2nd dose of Shingrix. Shingrix was given in the right arm. Patient tolerated well. Prior to injection, injection was ran through [facility] and we were told to file it through insurance.

## 2018-06-11 ENCOUNTER — Ambulatory Visit (INDEPENDENT_AMBULATORY_CARE_PROVIDER_SITE_OTHER): Payer: Medicare Other

## 2018-06-11 DIAGNOSIS — Z23 Encounter for immunization: Secondary | ICD-10-CM

## 2018-06-11 NOTE — Progress Notes (Signed)
Patient was in office to receive his flu vaccine.Patient received vaccine in his right deltoid. Patient tolerated well

## 2018-07-01 ENCOUNTER — Ambulatory Visit: Payer: Medicare Other | Admitting: Physician Assistant

## 2018-07-01 ENCOUNTER — Encounter: Payer: Self-pay | Admitting: Physician Assistant

## 2018-07-01 VITALS — BP 122/78 | HR 100 | Temp 98.4°F | Resp 20 | Ht 73.0 in | Wt 276.4 lb

## 2018-07-01 DIAGNOSIS — B9689 Other specified bacterial agents as the cause of diseases classified elsewhere: Secondary | ICD-10-CM | POA: Diagnosis not present

## 2018-07-01 DIAGNOSIS — J988 Other specified respiratory disorders: Secondary | ICD-10-CM | POA: Diagnosis not present

## 2018-07-01 MED ORDER — PREDNISONE 20 MG PO TABS: 20.0000 mg | ORAL_TABLET | Freq: Every day | ORAL | 0 refills | Status: DC

## 2018-07-01 MED ORDER — HYDROCODONE-HOMATROPINE 5-1.5 MG/5ML PO SYRP: 5.0000 mL | ORAL_SOLUTION | Freq: Four times a day (QID) | ORAL | 0 refills | Status: DC | PRN

## 2018-07-01 MED ORDER — AZITHROMYCIN 250 MG PO TABS: ORAL_TABLET | ORAL | 0 refills | Status: AC

## 2018-07-01 NOTE — Progress Notes (Signed)
Patient ID: Melvin Morrison MRN: [id number], DOB: Dec 18, 1950, 67 y.o. Date of Encounter: 07/01/2018, 2:44 PM    Chief Complaint:  Chief Complaint  Patient presents with  . Cough    symptoms for 4 days      HPI: 67 y.o. year old male presents with above.   He reports that he has been having cough and chest congestion for 4 or 5 days.  "I have coughed a billion times."  Says that he has had no congestion in his head or nose.  Is all in his chest.  Feels like he is having some wheezing at times.  States that he never smoked.  States that he has had some asthma that is allergy related.  Says that if he is mowing grass and that kind of thing, that is when he will sometimes have some problems with allergies and asthma.  Has had no fevers or chills.  Throat has been a little bit sore secondary to a lot of coughing but no other significant sore throat.     Home Meds:   Outpatient Medications Prior to Visit  Medication Sig Dispense Refill  . albuterol (PROAIR HFA) 108 (90 Base) MCG/ACT inhaler INHALE 2 PUFFS INTO THE LUNGS EVERY 6 HOURS AS NEEDED FOR WHEEZING OR SHORTNESS OF BREATH 8.5 Inhaler 3  . alfuzosin (UROXATRAL) 10 MG 24 hr tablet Take 10 mg by mouth daily.    Marland Kitchen anastrozole (ARIMIDEX) 1 MG tablet Take 1 mg by mouth daily.  3  . esomeprazole (NEXIUM) 20 MG capsule Take 20 mg by mouth daily at 12 noon.     No facility-administered medications prior to visit.     Allergies: No Known Allergies    Review of Systems: See HPI for pertinent ROS. All other ROS negative.    Physical Exam: Blood pressure 122/78, pulse 100, temperature 98.4 F (36.9 C), temperature source Oral, resp. rate 20, height 6\' 1"  (1.854 m), weight 125.4 kg, SpO2 93 %., Body mass index is 36.47 kg/m. General: WNWD WM.  Appears in no acute distress. HEENT: Normocephalic, atraumatic, eyes without discharge, sclera non-icteric, nares are without discharge. Bilateral auditory canals clear, TM's are without  perforation, pearly grey and translucent with reflective cone of light bilaterally. Oral cavity moist, posterior pharynx without exudate, erythema, peritonsillar abscess.  Neck: Supple. No thyromegaly. No lymphadenopathy. Lungs: Clear bilaterally to auscultation without wheezes, rales, or rhonchi. Breathing is unlabored. Heart: Regular rhythm. No murmurs, rubs, or gallops. Msk:  Strength and tone normal for age. Extremities/Skin: Warm and dry.  Neuro: Alert and oriented X 3. Moves all extremities spontaneously. Gait is normal. CNII-XII grossly in tact. Psych:  Responds to questions appropriately with a normal affect.     ASSESSMENT AND PLAN:  67 y.o. year old male with   1. Bacterial respiratory infection He is to take azithromycin and the prednisone as directed.  Also can use the Hycodan as cough suppressant as needed especially at night so that he can sleep.  Follow-up if symptoms do not resolve within 1 week after completion of antibiotic. - azithromycin (ZITHROMAX Z-PAK) 250 MG tablet; Take 2 tablets (500 mg) on  Day 1,  followed by 1 tablet (250 mg) once daily on Days 2 through 5.  Dispense: 6 each; Refill: 0 - predniSONE (DELTASONE) 20 MG tablet; Take 1 tablet (20 mg total) by mouth daily with breakfast.  Dispense: 5 tablet; Refill: 0 - HYDROcodone-homatropine (HYCODAN) 5-1.5 MG/5ML syrup; Take 5 mLs by mouth every 6 (  six) hours as needed.  Dispense: 120 mL; Refill: 0   Signed, [address] [place], [place], [facility] 07/01/2018 2:44 PM

## 2018-09-03 ENCOUNTER — Encounter: Payer: Self-pay | Admitting: Family Medicine

## 2018-09-03 ENCOUNTER — Ambulatory Visit: Payer: Medicare Other | Admitting: Family Medicine

## 2018-09-03 VITALS — BP 122/80 | HR 84 | Temp 98.8°F | Resp 16 | Ht 73.0 in | Wt 281.4 lb

## 2018-09-03 DIAGNOSIS — R059 Cough, unspecified: Secondary | ICD-10-CM

## 2018-09-03 DIAGNOSIS — J45901 Unspecified asthma with (acute) exacerbation: Secondary | ICD-10-CM | POA: Diagnosis not present

## 2018-09-03 DIAGNOSIS — J069 Acute upper respiratory infection, unspecified: Secondary | ICD-10-CM

## 2018-09-03 DIAGNOSIS — R05 Cough: Secondary | ICD-10-CM | POA: Diagnosis not present

## 2018-09-03 MED ORDER — IPRATROPIUM-ALBUTEROL 0.5-2.5 (3) MG/3ML IN SOLN
3.0000 mL | Freq: Once | RESPIRATORY_TRACT | Status: AC
  Administered 2018-09-03: 3 mL via RESPIRATORY_TRACT

## 2018-09-03 MED ORDER — ALBUTEROL SULFATE (2.5 MG/3ML) 0.083% IN NEBU
2.5000 mg | INHALATION_SOLUTION | Freq: Four times a day (QID) | RESPIRATORY_TRACT | 1 refills | Status: DC | PRN
Start: 2018-09-03 — End: 2018-09-09

## 2018-09-03 MED ORDER — PREDNISONE 20 MG PO TABS: ORAL_TABLET | ORAL | 0 refills | Status: DC

## 2018-09-03 MED ORDER — NEBULIZER/TUBING/MOUTHPIECE KIT: PACK | 0 refills | Status: DC

## 2018-09-03 MED ORDER — HYDROCODONE-HOMATROPINE 5-1.5 MG/5ML PO SYRP
5.0000 mL | ORAL_SOLUTION | Freq: Three times a day (TID) | ORAL | 0 refills | Status: DC | PRN
Start: 2018-09-03 — End: 2019-03-27

## 2018-09-03 MED ORDER — BENZONATATE 100 MG PO CAPS: 100.0000 mg | ORAL_CAPSULE | Freq: Three times a day (TID) | ORAL | 0 refills | Status: DC | PRN

## 2018-09-03 MED ORDER — MONTELUKAST SODIUM 10 MG PO TABS: 10.0000 mg | ORAL_TABLET | Freq: Every day | ORAL | 3 refills | Status: DC

## 2018-09-03 NOTE — Patient Instructions (Signed)
Do a steroid nasal spray and antihistamines daily for the next two weeks to help decrease swelling and nasal drainage.    Take steroids, use inhaler more frequently, take mucinex and can use cough medicine as needed.

## 2018-09-03 NOTE — Progress Notes (Signed)
Patient ID: Melvin Morrison, male    DOB: 07-23-1951, 67 y.o.   MRN: [id number]  PCP: Susy Frizzle, MD  Chief Complaint  Patient presents with  . Cough    Onset 2 months ago. Has c/o productive cough, and runny nose    Subjective:   Melvin Morrison is a 67 y.o. male, presents to clinic with CC of cough and asthma exacerbation x 2 months.  He was seen and treated for same in October.  He states most of his symptoms improved except for coughing.  Over the last week or so and particularly yesterday he has had severe worsening of his symptoms with nasal congestion, postnasal drip, worsening cough shortness of breath and wheeze.  He is using his inhaler more frequently, and last night he woke up at least 3 times short of breath.  Has a history of asthma with severe allergies.  He states that 15 years ago he was on allergy shots but when they did not change anything he stopped taking them.  He is currently not on any asthma trouble medication or allergy maintenance medication.  He states that over the last 2 months the colder weather is definitely a trigger for asthma attacks and coughing fits.      Patient Active Problem List   Diagnosis Date Noted  . Sleep apnea   . BPH (benign prostatic hyperplasia)      Prior to Admission medications   Medication Sig Start Date End Date Taking? Authorizing Provider  albuterol (PROAIR HFA) 108 (90 Base) MCG/ACT inhaler INHALE 2 PUFFS INTO THE LUNGS EVERY 6 HOURS AS NEEDED FOR WHEEZING OR SHORTNESS OF BREATH 03/01/18  Yes Susy Frizzle, MD  alfuzosin (UROXATRAL) 10 MG 24 hr tablet Take 10 mg by mouth daily.   Yes [provider]  omeprazole (PRILOSEC) 20 MG capsule Take 20 mg by mouth daily.   Yes [provider]     No Known Allergies   Family History  Problem Relation Age of Onset  . Diabetes Mother   . Diabetes Maternal Grandmother      Social History   Socioeconomic History  . Marital status: Single   Spouse name: Not on file  . Number of children: Not on file  . Years of education: Not on file  . Highest education level: Not on file  Occupational History  . Not on file  Social Needs  . Financial resource strain: Not on file  . Food insecurity:    Worry: Not on file    Inability: Not on file  . Transportation needs:    Medical: Not on file    Non-medical: Not on file  Tobacco Use  . Smoking status: Never Smoker  . Smokeless tobacco: Never Used  Substance and Sexual Activity  . Alcohol use: Yes    Comment: rare  . Drug use: No  . Sexual activity: Yes    Comment: married, Furniture conservator/restorer.  Lifestyle  . Physical activity:    Days per week: Not on file    Minutes per session: Not on file  . Stress: Not on file  Relationships  . Social connections:    Talks on phone: Not on file    Gets together: Not on file    Attends religious service: Not on file    Active member of club or organization: Not on file    Attends meetings of clubs or organizations: Not on file    Relationship status: Not on  file  . Intimate partner violence:    Fear of current or ex partner: Not on file    Emotionally abused: Not on file    Physically abused: Not on file    Forced sexual activity: Not on file  Other Topics Concern  . Not on file  Social History Narrative  . Not on file     Review of Systems  Constitutional: Negative.   HENT: Negative.   Eyes: Negative.   Respiratory: Negative.   Cardiovascular: Negative.   Gastrointestinal: Negative.   Endocrine: Negative.   Genitourinary: Negative.   Musculoskeletal: Negative.   Skin: Negative.   Allergic/Immunologic: Negative.   Neurological: Negative.   Hematological: Negative.   Psychiatric/Behavioral: Negative.   All other systems reviewed and are negative.      Objective:    Vitals:   09/03/18 1529  BP: 122/80  Pulse: 84  Resp: 16  Temp: 98.8 F (37.1 C)  TempSrc: Oral  SpO2: 95%  Weight: 281 lb 6 oz (127.6 kg)  Height: 6'  1" (1.854 m)      Physical Exam Vitals signs and nursing note reviewed.  Constitutional:      General: He is not in acute distress.    Appearance: Normal appearance. He is well-developed. He is not toxic-appearing or diaphoretic.  HENT:     Head: Normocephalic and atraumatic.     Jaw: No trismus.     Right Ear: Tympanic membrane, ear canal and external ear normal.     Left Ear: Tympanic membrane, ear canal and external ear normal.     Nose: Mucosal edema and rhinorrhea present. No congestion.     Right Sinus: No maxillary sinus tenderness or frontal sinus tenderness.     Left Sinus: No maxillary sinus tenderness or frontal sinus tenderness.     Mouth/Throat:     Mouth: Mucous membranes are not pale, not dry and not cyanotic.     Pharynx: Uvula midline. Posterior oropharyngeal erythema present. No oropharyngeal exudate or uvula swelling.     Tonsils: No tonsillar exudate or tonsillar abscesses.  Eyes:     General: Lids are normal.        Right eye: No discharge.        Left eye: No discharge.     Conjunctiva/sclera: Conjunctivae normal.     Pupils: Pupils are equal, round, and reactive to light.  Neck:     Musculoskeletal: Normal range of motion and neck supple.     Trachea: Trachea and phonation normal. No tracheal deviation.  Cardiovascular:     Rate and Rhythm: Normal rate and regular rhythm.     Pulses: Normal pulses.          Radial pulses are 2+ on the right side and 2+ on the left side.     Heart sounds: Normal heart sounds. No murmur. No friction rub. No gallop.   Pulmonary:     Effort: Pulmonary effort is normal. No tachypnea, accessory muscle usage or respiratory distress.     Breath sounds: No stridor. Wheezing present. No decreased breath sounds, rhonchi or rales.  Abdominal:     General: Bowel sounds are normal. There is no distension.     Palpations: Abdomen is soft.     Tenderness: There is no abdominal tenderness.  Musculoskeletal: Normal range of motion.    Skin:    General: Skin is warm and dry.     Capillary Refill: Capillary refill takes less than 2 seconds.  Coloration: Skin is not pale.     Findings: No rash.     Nails: There is no clubbing.   Neurological:     Mental Status: He is alert and oriented to person, place, and time.     Motor: No abnormal muscle tone.     Coordination: Coordination normal.     Gait: Gait normal.  Psychiatric:        Speech: Speech normal.        Behavior: Behavior normal. Behavior is cooperative.           Assessment & Plan:   67 y/o male, presents with reported hx of asthma, has URI sx, asthma exacerbation and also has had a cough for 2 months.  Tx for asthma exacerbation. Cough - unclear if it cough variant asthma?  Lungs with wheeze and no rales or rhonchi, does seem like bronchospasm limiting deep inspiration.  May also be related to some GERD?  And pt has uncontrolled allergies, he endorses severe allergies requiring allergy injections in the past, he is on no new meds now, so may also simply be allergic rhinitis and post nasal drip causing prolonged coughing.    tx all of the above and below - follow up if cough or asthma exacerbation is not improving.     ICD-10-CM   1. Exacerbation of asthma, unspecified asthma severity, unspecified whether persistent J45.901 predniSONE (DELTASONE) 20 MG tablet    montelukast (SINGULAIR) 10 MG tablet    ipratropium-albuterol (DUONEB) 0.5-2.5 (3) MG/3ML nebulizer solution 3 mL    albuterol (PROVENTIL) (2.5 MG/3ML) 0.083% nebulizer solution    Respiratory Therapy Supplies (NEBULIZER/TUBING/MOUTHPIECE) KIT  2. Upper respiratory tract infection, unspecified type J06.9 predniSONE (DELTASONE) 20 MG tablet    ipratropium-albuterol (DUONEB) 0.5-2.5 (3) MG/3ML nebulizer solution 3 mL  3. Cough R05 predniSONE (DELTASONE) 20 MG tablet    benzonatate (TESSALON) 100 MG capsule    HYDROcodone-homatropine (HYCODAN) 5-1.5 MG/5ML syrup    DG Chest [address]        Delsa Grana, [place] 09/03/18 3:50 PM

## 2018-09-05 ENCOUNTER — Telehealth: Payer: Self-pay | Admitting: Family Medicine

## 2018-09-05 DIAGNOSIS — J45901 Unspecified asthma with (acute) exacerbation: Secondary | ICD-10-CM

## 2018-09-05 NOTE — Telephone Encounter (Signed)
Spoke with patient's wife and informed her that patient's medications were sent into the pharmacy on Tuesday. Patient wife then informed me that the pharmacy stated that they needed clarification on the prednisone and they also needed clarification on nebulizer solution. I called the pharmacy and was told that they needed clarification on the prednisone signature and that the nebulizer needed PA for nebulizer solution. I asked pharmacy to submit a PA request to our office. The pharmacist verbalized understanding. Called patient's wife back and explained the situation to her.

## 2018-09-05 NOTE — Telephone Encounter (Signed)
Pts wife called in upset about patients prescriptions that were called in on 09/04/2018 states that they were all not called in. Please call her back asap because she is UPSET!!

## 2018-09-09 MED ORDER — ALBUTEROL SULFATE (2.5 MG/3ML) 0.083% IN NEBU: 2.5000 mg | INHALATION_SOLUTION | Freq: Four times a day (QID) | RESPIRATORY_TRACT | 1 refills | Status: DC | PRN

## 2018-09-09 NOTE — Addendum Note (Signed)
Addended by: Legrand RamsWILLIS, Dwanda Tufano B on: 09/09/2018 02:01 PM   Modules accepted: Orders

## 2018-09-09 NOTE — Telephone Encounter (Signed)
[facility] - sent note to pharm to file through part B.

## 2018-09-09 NOTE — Telephone Encounter (Signed)
PA Submitted through [email or web address] and received the following:  Your information has been submitted to [employer]. [employer] will review the request and will issue a decision, typically within 1-3 days from your submission. You can check the updated outcome later by reopening this request.  If [employer] has not responded in 1-3 days or if you have any questions about your ePA request, please contact [employer] at [phone]. If you think there may be a problem with your PA request, use our live chat feature at the bottom right. 

## 2019-03-27 ENCOUNTER — Other Ambulatory Visit: Payer: Self-pay

## 2019-03-27 ENCOUNTER — Ambulatory Visit (INDEPENDENT_AMBULATORY_CARE_PROVIDER_SITE_OTHER): Payer: Medicare Other | Admitting: Family Medicine

## 2019-03-27 VITALS — BP 140/82 | HR 88 | Temp 98.7°F | Resp 16 | Ht 72.0 in | Wt 275.0 lb

## 2019-03-27 DIAGNOSIS — Z23 Encounter for immunization: Secondary | ICD-10-CM | POA: Diagnosis not present

## 2019-03-27 DIAGNOSIS — R05 Cough: Secondary | ICD-10-CM

## 2019-03-27 DIAGNOSIS — R053 Chronic cough: Secondary | ICD-10-CM

## 2019-03-27 DIAGNOSIS — Z0001 Encounter for general adult medical examination with abnormal findings: Secondary | ICD-10-CM | POA: Diagnosis not present

## 2019-03-27 DIAGNOSIS — Z0000 Encounter for general adult medical examination without abnormal findings: Secondary | ICD-10-CM

## 2019-03-27 MED ORDER — ESOMEPRAZOLE MAGNESIUM 40 MG PO CPDR: 40.0000 mg | DELAYED_RELEASE_CAPSULE | Freq: Every day | ORAL | 3 refills | Status: DC

## 2019-03-27 MED ORDER — MONTELUKAST SODIUM 10 MG PO TABS: 10.0000 mg | ORAL_TABLET | Freq: Every day | ORAL | 3 refills | Status: DC

## 2019-03-27 NOTE — Progress Notes (Signed)
Subjective:    Patient ID: Melvin Morrison, male    DOB: 07/24/1951, 68 y.o.   MRN: [id number]  HPI Patient is here today for complete physical exam.  His last colonoscopy was 2017 and is up-to-date.  He sees a urologist to check his PSA and perform a digital rectal exam.  His immunizations are listed below.  He is due for Prevnar 13.  His only concern is her chronic cough.  He states that he has suffered with his chronic cough off and on for the last 6 or 7 months.  He does have a history of asthma and has had 3 separate asthma exacerbations over the last year.  He also has a history of allergies.  However the cough is nonproductive.  Is more of an irritant cough or tickle that stays in his throat constantly.  He denies any hemoptysis, fevers chills weight loss night sweats or respiratory distress. Immunization History  Administered Date(s) Administered  . Influenza,inj,Quad PF,6+ Mos 06/14/2017, 06/11/2018  . Influenza-Unspecified 06/29/2015, 07/06/2016  . Pneumococcal Polysaccharide-23 12/25/2013, 02/26/2018  . Tdap 02/26/2018  . Zoster Recombinat (Shingrix) 03/26/2018, 05/28/2018    Past Medical History:  Diagnosis Date  . Asthma   . BPH (benign prostatic hyperplasia)   . Colon polyps   . DDD (degenerative disc disease), lumbar    L4-5  . GERD (gastroesophageal reflux disease)   . Sleep apnea    No past surgical history on file. Current Outpatient Medications on File Prior to Visit  Medication Sig Dispense Refill  . albuterol (PROAIR HFA) 108 (90 Base) MCG/ACT inhaler INHALE 2 PUFFS INTO THE LUNGS EVERY 6 HOURS AS NEEDED FOR WHEEZING OR SHORTNESS OF BREATH 8.5 Inhaler 3  . albuterol (PROVENTIL) (2.5 MG/3ML) 0.083% nebulizer solution Take 3 mLs (2.5 mg total) by nebulization every 6 (six) hours as needed for wheezing or shortness of breath. 150 mL 1  . alfuzosin (UROXATRAL) 10 MG 24 hr tablet Take 10 mg by mouth daily.    . montelukast (SINGULAIR) 10 MG tablet Take 1 tablet (10  mg total) by mouth at bedtime. 30 tablet 3  . omeprazole (PRILOSEC) 20 MG capsule Take 20 mg by mouth daily.    Marland Kitchen Respiratory Therapy Supplies (NEBULIZER/TUBING/MOUTHPIECE) KIT Disp one nebulizer machine, tubing set and mouthpiece kit 1 each 0   No current facility-administered medications on file prior to visit.    No Known Allergies Social History   Socioeconomic History  . Marital status: Single    Spouse name: Not on file  . Number of children: Not on file  . Years of education: Not on file  . Highest education level: Not on file  Occupational History  . Not on file  Social Needs  . Financial resource strain: Not on file  . Food insecurity    Worry: Not on file    Inability: Not on file  . Transportation needs    Medical: Not on file    Non-medical: Not on file  Tobacco Use  . Smoking status: Never Smoker  . Smokeless tobacco: Never Used  Substance and Sexual Activity  . Alcohol use: Yes    Comment: rare  . Drug use: No  . Sexual activity: Yes    Comment: married, Furniture conservator/restorer.  Lifestyle  . Physical activity    Days per week: Not on file    Minutes per session: Not on file  . Stress: Not on file  Relationships  . Social Herbalist on  phone: Not on file    Gets together: Not on file    Attends religious service: Not on file    Active member of club or organization: Not on file    Attends meetings of clubs or organizations: Not on file    Relationship status: Not on file  . Intimate partner violence    Fear of current or ex partner: Not on file    Emotionally abused: Not on file    Physically abused: Not on file    Forced sexual activity: Not on file  Other Topics Concern  . Not on file  Social History Narrative  . Not on file   Family History  Problem Relation Age of Onset  . Diabetes Mother   . Diabetes Maternal Grandmother        Review of Systems  All other systems reviewed and are negative.      Objective:   Physical Exam   Constitutional: He is oriented to person, place, and time. He appears well-developed and well-nourished. No distress.  HENT:  Head: Normocephalic and atraumatic.  Right Ear: External ear normal.  Left Ear: External ear normal.  Nose: Nose normal.  Mouth/Throat: Oropharynx is clear and moist. No oropharyngeal exudate.  Eyes: Pupils are equal, round, and reactive to light. Conjunctivae and EOM are normal. Right eye exhibits no discharge. Left eye exhibits no discharge. No scleral icterus.  Neck: Normal range of motion. Neck supple. No JVD present. No tracheal deviation present. No thyromegaly present.  Cardiovascular: Normal rate, regular rhythm, normal heart sounds and intact distal pulses. Exam reveals no gallop and no friction rub.  No murmur heard. Pulmonary/Chest: Effort normal and breath sounds normal. No stridor. No respiratory distress. He has no wheezes. He has no rales. He exhibits no tenderness.  Abdominal: Soft. Bowel sounds are normal. He exhibits no distension and no mass. There is no abdominal tenderness. There is no rebound and no guarding.  Musculoskeletal: Normal range of motion.        General: No tenderness or edema.  Lymphadenopathy:    He has no cervical adenopathy.  Neurological: He is alert and oriented to person, place, and time. He has normal reflexes. No cranial nerve deficit. He exhibits normal muscle tone. Coordination normal.  Skin: Skin is warm. No rash noted. He is not diaphoretic. No erythema. No pallor.  Psychiatric: He has a normal mood and affect. His behavior is normal. Judgment and thought content normal.  Vitals reviewed.         Assessment & Plan:  1. General medical exam  - CBC with Differential/Platelet - COMPLETE METABOLIC PANEL WITH GFR - Lipid panel  2. Chronic cough  - DG Chest 2 View; Future Physical exam today is normal except for obesity.  Recommended Prevnar 13.  The remainder of his immunizations are up-to-date.  His colonoscopy  is up-to-date.  His prostate cancer screening test/PSA is performed at his urologist office.  I will obtain a CBC, CMP, fasting lipid panel.  Given his chronic cough, I will check a chest x-ray.  If his chest x-ray is normal, I believe his cough is likely due to either untreated asthma, chronic allergies, or acid reflux.  Therefore I recommended a trial of Nexium 40 mg a day coupled with Singulair 10 mg a day and then reassess via telephone in 1 month to see if the cough is improved.

## 2019-03-27 NOTE — Addendum Note (Signed)
Addended by: Shary Decamp B on: 03/27/2019 05:01 PM   Modules accepted: Orders

## 2019-03-29 LAB — COMPLETE METABOLIC PANEL WITH GFR
AG Ratio: 1.8 (calc) (ref 1.0–2.5)
ALT: 29 U/L (ref 9–46)
AST: 19 U/L (ref 10–35)
Albumin: 4.4 g/dL (ref 3.6–5.1)
Alkaline phosphatase (APISO): 70 U/L (ref 35–144)
BUN: 17 mg/dL (ref 7–25)
CO2: 24 mmol/L (ref 20–32)
Calcium: 9.5 mg/dL (ref 8.6–10.3)
Chloride: 105 mmol/L (ref 98–110)
Creat: 1.14 mg/dL (ref 0.70–1.25)
GFR, Est African American: 77 mL/min/{1.73_m2} (ref >=60)
GFR, Est Non African American: 67 mL/min/{1.73_m2} (ref >=60)
Globulin: 2.5 g/dL (calc) (ref 1.9–3.7)
Glucose, Bld: 121 mg/dL — ABNORMAL HIGH (ref 65–99)
Potassium: 4.3 mmol/L (ref 3.5–5.3)
Sodium: 139 mmol/L (ref 135–146)
Total Bilirubin: 1.5 mg/dL — ABNORMAL HIGH (ref 0.2–1.2)
Total Protein: 6.9 g/dL (ref 6.1–8.1)

## 2019-03-29 LAB — CBC WITH DIFFERENTIAL/PLATELET
Absolute Monocytes: 603 cells/uL (ref 200–950)
Basophils Absolute: 40 cells/uL (ref 0–200)
Basophils Relative: 0.6 %
Eosinophils Absolute: 80 cells/uL (ref 15–500)
Eosinophils Relative: 1.2 %
HCT: 47 % (ref 38.5–50.0)
Hemoglobin: 15.9 g/dL (ref 13.2–17.1)
Lymphs Abs: 1461 cells/uL (ref 850–3900)
MCH: 30 pg (ref 27.0–33.0)
MCHC: 33.8 g/dL (ref 32.0–36.0)
MCV: 88.7 fL (ref 80.0–100.0)
MPV: 10.1 fL (ref 7.5–12.5)
Monocytes Relative: 9 %
Neutro Abs: 4516 cells/uL (ref 1500–7800)
Neutrophils Relative %: 67.4 %
Platelets: 276 10*3/uL (ref 140–400)
RBC: 5.3 10*6/uL (ref 4.20–5.80)
RDW: 12.3 % (ref 11.0–15.0)
Total Lymphocyte: 21.8 %
WBC: 6.7 10*3/uL (ref 3.8–10.8)

## 2019-03-29 LAB — HEMOGLOBIN A1C W/OUT EAG: Hgb A1c MFr Bld: 6.3 % of total Hgb — ABNORMAL HIGH (ref <5.7)

## 2019-03-29 LAB — LIPID PANEL
Cholesterol: 193 mg/dL (ref <200)
HDL: 36 mg/dL — ABNORMAL LOW (ref >=40)
LDL Cholesterol (Calc): 136 mg/dL (calc) — ABNORMAL HIGH
Non-HDL Cholesterol (Calc): 157 mg/dL (calc) — ABNORMAL HIGH (ref <130)
Total CHOL/HDL Ratio: 5.4 (calc) — ABNORMAL HIGH (ref <5.0)
Triglycerides: 105 mg/dL (ref <150)

## 2019-03-29 LAB — TEST AUTHORIZATION

## 2019-03-31 ENCOUNTER — Other Ambulatory Visit: Payer: Self-pay | Admitting: Family Medicine

## 2019-03-31 ENCOUNTER — Encounter: Payer: Self-pay | Admitting: Family Medicine

## 2019-03-31 DIAGNOSIS — R7303 Prediabetes: Secondary | ICD-10-CM | POA: Insufficient documentation

## 2019-03-31 MED ORDER — ATORVASTATIN CALCIUM 20 MG PO TABS: 20.0000 mg | ORAL_TABLET | Freq: Every day | ORAL | 1 refills | Status: DC

## 2019-04-22 ENCOUNTER — Ambulatory Visit
Admission: RE | Admit: 2019-04-22 | Discharge: 2019-04-22 | Disposition: A | Discharge status: Home / Self Care | Payer: BLUE CROSS/BLUE SHIELD | Source: Ambulatory Visit | Attending: Family Medicine | Admitting: Family Medicine

## 2019-04-22 DIAGNOSIS — R05 Cough: Secondary | ICD-10-CM

## 2019-04-22 DIAGNOSIS — R053 Chronic cough: Secondary | ICD-10-CM

## 2019-05-20 ENCOUNTER — Ambulatory Visit (INDEPENDENT_AMBULATORY_CARE_PROVIDER_SITE_OTHER): Payer: Medicare Other

## 2019-05-20 ENCOUNTER — Other Ambulatory Visit: Payer: Self-pay

## 2019-05-20 DIAGNOSIS — Z23 Encounter for immunization: Secondary | ICD-10-CM

## 2019-05-20 NOTE — Progress Notes (Signed)
Patient came in to receive his annual flu vaccine. Fluad was given in the right deltoid. Patient tolerated well. VIS was given.

## 2019-05-21 ENCOUNTER — Ambulatory Visit: Payer: Medicare Other

## 2019-06-13 ENCOUNTER — Telehealth: Payer: Self-pay | Admitting: Podiatry

## 2019-06-13 ENCOUNTER — Telehealth: Payer: Self-pay

## 2019-06-13 NOTE — Telephone Encounter (Signed)
Pt left message stating he is wanting an additional pair of orthotics ordered like he ones he got 09/2017.   I returned call and line is busy will try again Monday.

## 2019-06-13 NOTE — Telephone Encounter (Signed)
Patient called requesting another pair of orthotics, he received some back in January 2019 and wants an additional pair.

## 2019-06-22 ENCOUNTER — Other Ambulatory Visit: Payer: Self-pay | Admitting: Family Medicine

## 2019-06-25 ENCOUNTER — Ambulatory Visit: Payer: Medicare Other | Admitting: Podiatry

## 2019-06-25 ENCOUNTER — Other Ambulatory Visit: Payer: Self-pay

## 2019-06-25 DIAGNOSIS — M722 Plantar fascial fibromatosis: Secondary | ICD-10-CM

## 2019-06-29 NOTE — Progress Notes (Signed)
   Subjective: 68 y.o. male presenting today for follow up evaluation of plantar fasciitis bilaterally. He states he is doing well but is interested in another pair of orthotics. He has had his current pair for one year and reports they have being helping to prevent flare ups. He denies any new complaints at this time. Patient is here for further evaluation and treatment.   Past Medical History:  Diagnosis Date  . Asthma   . BPH (benign prostatic hyperplasia)   . Colon polyps   . DDD (degenerative disc disease), lumbar    L4-5  . GERD (gastroesophageal reflux disease)   . Prediabetes   . Sleep apnea      Objective: Physical Exam General: The patient is alert and oriented x3 in no acute distress.  Dermatology: Skin is warm, dry and supple bilateral lower extremities. Negative for open lesions or macerations bilateral.   Vascular: Dorsalis Pedis and Posterior Tibial pulses palpable bilateral.  Capillary fill time is immediate to all digits.  Neurological: Epicritic and protective threshold intact bilateral.   Musculoskeletal: Tenderness to palpation to the plantar aspect of the bilateral heels along the plantar fascia. All other joints range of motion within normal limits bilateral. Strength 5/5 in all groups bilateral.    Assessment: 1. plantar fasciitis bilateral feet - improved   Plan of Care:  1. Patient evaluated.  2. Patient wants a second pair of custom orthotics.  3. Appointment with Liliane Channel, Pedorthist, for custom molded orthotics.  4. Return to clinic as needed.    Edrick Kins, DPM [facility]  Dr. Edrick Kins, DPM    [address], [place] [address]                Office [phone]  Fax [phone]

## 2019-06-30 ENCOUNTER — Telehealth: Payer: Self-pay | Admitting: Podiatry

## 2019-06-30 NOTE — Telephone Encounter (Signed)
Called pt and spoke to pt and wife about ordering a second pair of orthotics. I just wanted to make them aware that medicare/medicare advantage plans usually do not cover the orthotics and the cost is 398.00. They are aware and she has contacted insurance and they told her they would be covered and I explained they say that but there is a very specific criteria that it has to meet and it generally does not but we will file the insurance. I just wanted to make sure they were aware of the cost and did not possibly get a surprise bill for the 398.00

## 2019-07-20 ENCOUNTER — Other Ambulatory Visit: Payer: Self-pay | Admitting: Family Medicine

## 2019-07-29 ENCOUNTER — Telehealth: Payer: Self-pay | Admitting: Family Medicine

## 2019-07-29 MED ORDER — ESOMEPRAZOLE MAGNESIUM 40 MG PO CPDR: DELAYED_RELEASE_CAPSULE | ORAL | 3 refills | Status: DC

## 2019-07-29 NOTE — Telephone Encounter (Signed)
Pt would like a PA submitted for Nexium.

## 2019-08-19 MED ORDER — ESOMEPRAZOLE MAGNESIUM 40 MG PO CPDR: DELAYED_RELEASE_CAPSULE | ORAL | 3 refills | Status: DC

## 2019-08-19 NOTE — Telephone Encounter (Signed)
Looks like ins has qty limit on Nexium - sent [facility] to run as 30 day supply and let me know if still requiring PA.

## 2019-08-19 NOTE — Telephone Encounter (Signed)
Nexium is not on formulary at all - ins will not cover.

## 2019-08-20 NOTE — Telephone Encounter (Signed)
Received PA determination from [place].   PA denied.   Appeal faxed.

## 2019-08-21 MED ORDER — ESOMEPRAZOLE MAGNESIUM 40 MG PO CPDR: DELAYED_RELEASE_CAPSULE | ORAL | 3 refills | Status: DC

## 2019-08-21 NOTE — Telephone Encounter (Signed)
Received Appeal determination.   Appeal approved 05/22/2019- 08/19/2020.  Pharmacy made aware.

## 2019-08-22 MED ORDER — ESOMEPRAZOLE MAGNESIUM 40 MG PO CPDR: DELAYED_RELEASE_CAPSULE | ORAL | 3 refills | Status: DC

## 2019-08-22 NOTE — Addendum Note (Signed)
Addended by: Shary Decamp B on: 08/22/2019 04:33 PM   Modules accepted: Orders

## 2019-08-22 NOTE — Telephone Encounter (Signed)
Pt aware and med sent to [facility]

## 2019-09-04 ENCOUNTER — Other Ambulatory Visit: Payer: Self-pay

## 2019-09-05 ENCOUNTER — Encounter: Payer: Self-pay | Admitting: Family Medicine

## 2019-09-05 ENCOUNTER — Ambulatory Visit (INDEPENDENT_AMBULATORY_CARE_PROVIDER_SITE_OTHER): Payer: Medicare Other | Admitting: Family Medicine

## 2019-09-05 VITALS — BP 132/70 | HR 78 | Temp 96.9°F | Resp 18 | Ht 72.0 in | Wt 278.0 lb

## 2019-09-05 DIAGNOSIS — R202 Paresthesia of skin: Secondary | ICD-10-CM | POA: Diagnosis not present

## 2019-09-05 DIAGNOSIS — R200 Anesthesia of skin: Secondary | ICD-10-CM

## 2019-09-05 DIAGNOSIS — R7303 Prediabetes: Secondary | ICD-10-CM

## 2019-09-05 DIAGNOSIS — E7800 Pure hypercholesterolemia, unspecified: Secondary | ICD-10-CM

## 2019-09-05 NOTE — Progress Notes (Signed)
Subjective:    Patient ID: Melvin Morrison, male    DOB: 1951/01/07, 68 y.o.   MRN: [id number]  HPI  Patient presents today with 1 month history of numbness in his right third and fourth fingers.  It is constant.  It is located at the tips of his fingers.  There is no exacerbating or alleviating factors.  Patient does have a positive Tinel's sign today which is not present on the left side.  However Phalen sign is negative.  He denies any neck pain.  He has a negative Spurling sign.  He has normal reflexes checked at the brachial radialis, the triceps, and the biceps.  He denies any arm weakness or numbness.  He denies any numbness or tingling in his left hand.  He denies any numbness or tingling in his feet although he does have a history of prediabetes. Past Medical History:  Diagnosis Date  . Asthma   . BPH (benign prostatic hyperplasia)   . Colon polyps   . DDD (degenerative disc disease), lumbar    L4-5  . GERD (gastroesophageal reflux disease)   . Prediabetes   . Sleep apnea    No past surgical history on file. Current Outpatient Medications on File Prior to Visit  Medication Sig Dispense Refill  . albuterol (PROAIR HFA) 108 (90 Base) MCG/ACT inhaler INHALE 2 PUFFS INTO THE LUNGS EVERY 6 HOURS AS NEEDED FOR WHEEZING OR SHORTNESS OF BREATH 8.5 Inhaler 3  . albuterol (PROVENTIL) (2.5 MG/3ML) 0.083% nebulizer solution Take 3 mLs (2.5 mg total) by nebulization every 6 (six) hours as needed for wheezing or shortness of breath. 150 mL 1  . alfuzosin (UROXATRAL) 10 MG 24 hr tablet Take 10 mg by mouth daily. MY'T by Dr. Jeffie Pollock    . esomeprazole (NEXIUM) 40 MG capsule TAKE 1 CAPSULE BY MOUTH EVERY DAY 30 capsule 3  . montelukast (SINGULAIR) 10 MG tablet Take 1 tablet (10 mg total) by mouth at bedtime. 30 tablet 3  . Respiratory Therapy Supplies (NEBULIZER/TUBING/MOUTHPIECE) KIT Disp one nebulizer machine, tubing set and mouthpiece kit 1 each 0  . atorvastatin (LIPITOR) 20 MG tablet Take 1  tablet (20 mg total) by mouth daily. (Patient not taking: Reported on 09/05/2019) 90 tablet 1  . Testosterone 20.25 MG/ACT (1.62%) GEL RX'd by Dr. Jeffie Pollock     No current facility-administered medications on file prior to visit.   No Known Allergies Social History   Socioeconomic History  . Marital status: Single    Spouse name: Not on file  . Number of children: Not on file  . Years of education: Not on file  . Highest education level: Not on file  Occupational History  . Not on file  Tobacco Use  . Smoking status: Never Smoker  . Smokeless tobacco: Never Used  Substance and Sexual Activity  . Alcohol use: Yes    Comment: rare  . Drug use: No  . Sexual activity: Yes    Comment: married, Furniture conservator/restorer.  Other Topics Concern  . Not on file  Social History Narrative  . Not on file   Social Determinants of Health   Financial Resource Strain:   . Difficulty of Paying Living Expenses: Not on file  Food Insecurity:   . Worried About Charity fundraiser in the Last Year: Not on file  . Ran Out of Food in the Last Year: Not on file  Transportation Needs:   . Lack of Transportation (Medical): Not on file  . Lack  of Transportation (Non-Medical): Not on file  Physical Activity:   . Days of Exercise per Week: Not on file  . Minutes of Exercise per Session: Not on file  Stress:   . Feeling of Stress : Not on file  Social Connections:   . Frequency of Communication with Friends and Family: Not on file  . Frequency of Social Gatherings with Friends and Family: Not on file  . Attends Religious Services: Not on file  . Active Member of Clubs or Organizations: Not on file  . Attends Archivist Meetings: Not on file  . Marital Status: Not on file  Intimate Partner Violence:   . Fear of Current or Ex-Partner: Not on file  . Emotionally Abused: Not on file  . Physically Abused: Not on file  . Sexually Abused: Not on file     Review of Systems  All other systems reviewed and  are negative.      Objective:   Physical Exam Vitals reviewed.  Constitutional:      Appearance: Normal appearance. He is obese.  Neck:     Vascular: No carotid bruit.  Cardiovascular:     Rate and Rhythm: Normal rate and regular rhythm.     Heart sounds: Normal heart sounds.  Pulmonary:     Effort: Pulmonary effort is normal.     Breath sounds: Normal breath sounds.  Musculoskeletal:     Cervical back: Normal range of motion and neck supple. No rigidity or tenderness.  Neurological:     General: No focal deficit present.     Mental Status: He is alert and oriented to person, place, and time.     Cranial Nerves: No cranial nerve deficit.     Sensory: No sensory deficit.     Motor: No weakness.     Coordination: Coordination normal.     Gait: Gait normal.     Deep Tendon Reflexes: Reflexes normal.           Assessment & Plan:  Numbness and tingling in right hand - Plan: Vitamin B12, COMPLETE METABOLIC PANEL WITH GFR, CBC with Differential, TSH, Hemoglobin A1c  Prediabetes - Plan: Vitamin B12, COMPLETE METABOLIC PANEL WITH GFR, CBC with Differential, TSH, Hemoglobin A1c  Pure hypercholesterolemia - Plan: Lipid Panel  I suspect that the numbness in his hands is likely carpal tunnel syndrome although peripheral neuropathy is on the differential diagnosis.  Given his history of prediabetes I will check a hemoglobin A1c and with the numbness I will also check a TSH and a vitamin B12 along with a CBC and a CMP.  While checking fasting lab work I will check his cholesterol.  If the lab work is normal I have suggested nerve conduction studies of the upper extremities to determine the cause but I suspect carpal tunnel syndrome.

## 2019-09-06 LAB — CBC WITH DIFFERENTIAL/PLATELET
Absolute Monocytes: 650 cells/uL (ref 200–950)
Basophils Absolute: 60 cells/uL (ref 0–200)
Basophils Relative: 0.9 %
Eosinophils Absolute: 127 cells/uL (ref 15–500)
Eosinophils Relative: 1.9 %
HCT: 48.4 % (ref 38.5–50.0)
Hemoglobin: 16.1 g/dL (ref 13.2–17.1)
Lymphs Abs: 1856 cells/uL (ref 850–3900)
MCH: 30 pg (ref 27.0–33.0)
MCHC: 33.3 g/dL (ref 32.0–36.0)
MCV: 90.1 fL (ref 80.0–100.0)
MPV: 10.1 fL (ref 7.5–12.5)
Monocytes Relative: 9.7 %
Neutro Abs: 4007 cells/uL (ref 1500–7800)
Neutrophils Relative %: 59.8 %
Platelets: 302 10*3/uL (ref 140–400)
RBC: 5.37 10*6/uL (ref 4.20–5.80)
RDW: 12.2 % (ref 11.0–15.0)
Total Lymphocyte: 27.7 %
WBC: 6.7 10*3/uL (ref 3.8–10.8)

## 2019-09-06 LAB — HEMOGLOBIN A1C
Hgb A1c MFr Bld: 6.4 % of total Hgb — ABNORMAL HIGH (ref <5.7)
Mean Plasma Glucose: 137 (calc)
eAG (mmol/L): 7.6 (calc)

## 2019-09-06 LAB — LIPID PANEL
Cholesterol: 170 mg/dL (ref <200)
HDL: 40 mg/dL (ref >=40)
LDL Cholesterol (Calc): 110 mg/dL (calc) — ABNORMAL HIGH
Non-HDL Cholesterol (Calc): 130 mg/dL (calc) — ABNORMAL HIGH (ref <130)
Total CHOL/HDL Ratio: 4.3 (calc) (ref <5.0)
Triglycerides: 94 mg/dL (ref <150)

## 2019-09-06 LAB — COMPLETE METABOLIC PANEL WITH GFR
AG Ratio: 1.4 (calc) (ref 1.0–2.5)
ALT: 46 U/L (ref 9–46)
AST: 24 U/L (ref 10–35)
Albumin: 4.1 g/dL (ref 3.6–5.1)
Alkaline phosphatase (APISO): 79 U/L (ref 35–144)
BUN/Creatinine Ratio: 15 (calc) (ref 6–22)
BUN: 19 mg/dL (ref 7–25)
CO2: 26 mmol/L (ref 20–32)
Calcium: 9.6 mg/dL (ref 8.6–10.3)
Chloride: 105 mmol/L (ref 98–110)
Creat: 1.31 mg/dL — ABNORMAL HIGH (ref 0.70–1.25)
GFR, Est African American: 65 mL/min/{1.73_m2} (ref >=60)
GFR, Est Non African American: 56 mL/min/{1.73_m2} — ABNORMAL LOW (ref >=60)
Globulin: 2.9 g/dL (calc) (ref 1.9–3.7)
Glucose, Bld: 114 mg/dL — ABNORMAL HIGH (ref 65–99)
Potassium: 4.5 mmol/L (ref 3.5–5.3)
Sodium: 144 mmol/L (ref 135–146)
Total Bilirubin: 1 mg/dL (ref 0.2–1.2)
Total Protein: 7 g/dL (ref 6.1–8.1)

## 2019-09-06 LAB — VITAMIN B12: Vitamin B-12: 492 pg/mL (ref 200–1100)

## 2019-09-06 LAB — TSH: TSH: 2.99 mIU/L (ref 0.40–4.50)

## 2019-09-29 ENCOUNTER — Other Ambulatory Visit: Payer: Self-pay | Admitting: Family Medicine

## 2019-10-31 ENCOUNTER — Ambulatory Visit: Payer: BLUE CROSS/BLUE SHIELD

## 2019-10-31 ENCOUNTER — Ambulatory Visit: Payer: Self-pay | Attending: Internal Medicine

## 2019-10-31 DIAGNOSIS — Z23 Encounter for immunization: Secondary | ICD-10-CM | POA: Insufficient documentation

## 2019-10-31 NOTE — Progress Notes (Signed)
   Covid-19 Vaccination Clinic  Name:  JAKEVIOUS HOLLISTER    MRN: [id number] DOB: 07-11-1951  10/31/2019  Mr. Ashkar was observed post Covid-19 immunization for 15 minutes without incidence. He was provided with Vaccine Information Sheet and instruction to access the V-Safe system.   Mr. Wrightson was instructed to call 911 with any severe reactions post vaccine: Marland Kitchen Difficulty breathing  . Swelling of your face and throat  . A fast heartbeat  . A bad rash all over your body  . Dizziness and weakness    Immunizations Administered    Name Date Dose VIS Date Route   Pfizer COVID-19 Vaccine 10/31/2019  2:53 AM 0.3 mL 08/29/2019 Intramuscular   Manufacturer: [employer], [employer]   Lot: [id number]   NDC: [address]-9

## 2019-11-23 ENCOUNTER — Ambulatory Visit: Payer: Self-pay | Attending: Internal Medicine

## 2019-11-23 DIAGNOSIS — Z23 Encounter for immunization: Secondary | ICD-10-CM | POA: Insufficient documentation

## 2019-11-23 NOTE — Progress Notes (Signed)
   Covid-19 Vaccination Clinic  Name:  Melvin Morrison    MRN: [id number] DOB: 11-03-1950  11/23/2019  Mr. Melvin Morrison was observed post Covid-19 immunization for 15 minutes without incident. He was provided with Vaccine Information Sheet and instruction to access the V-Safe system.   Mr. Melvin Morrison was instructed to call 911 with any severe reactions post vaccine: Marland Kitchen Difficulty breathing  . Swelling of face and throat  . A fast heartbeat  . A bad rash all over body  . Dizziness and weakness   Immunizations Administered    Name Date Dose VIS Date Route   Pfizer COVID-19 Vaccine 11/23/2019  1:45 PM 0.3 mL 08/29/2019 Intramuscular   Manufacturer: [employer], [employer]   Lot: [id number]   NDC: [address]-4

## 2020-01-23 ENCOUNTER — Other Ambulatory Visit: Payer: Self-pay | Admitting: Family Medicine

## 2020-03-02 ENCOUNTER — Other Ambulatory Visit: Payer: Self-pay | Admitting: Orthopedic Surgery

## 2020-03-02 DIAGNOSIS — R609 Edema, unspecified: Secondary | ICD-10-CM

## 2020-03-02 DIAGNOSIS — R52 Pain, unspecified: Secondary | ICD-10-CM

## 2020-03-02 DIAGNOSIS — R531 Weakness: Secondary | ICD-10-CM

## 2020-04-01 ENCOUNTER — Other Ambulatory Visit: Payer: Self-pay

## 2020-04-01 ENCOUNTER — Ambulatory Visit (INDEPENDENT_AMBULATORY_CARE_PROVIDER_SITE_OTHER): Payer: Medicare Other | Admitting: Family Medicine

## 2020-04-01 VITALS — BP 130/78 | HR 95 | Temp 98.5°F | Ht 72.0 in | Wt 281.0 lb

## 2020-04-01 DIAGNOSIS — Z0000 Encounter for general adult medical examination without abnormal findings: Secondary | ICD-10-CM

## 2020-04-01 DIAGNOSIS — R7303 Prediabetes: Secondary | ICD-10-CM

## 2020-04-01 DIAGNOSIS — E7800 Pure hypercholesterolemia, unspecified: Secondary | ICD-10-CM

## 2020-04-01 DIAGNOSIS — Z0001 Encounter for general adult medical examination with abnormal findings: Secondary | ICD-10-CM | POA: Diagnosis not present

## 2020-04-01 DIAGNOSIS — G4730 Sleep apnea, unspecified: Secondary | ICD-10-CM | POA: Diagnosis not present

## 2020-04-01 NOTE — Progress Notes (Signed)
Subjective:    Patient ID: Melvin Morrison, male    DOB: [birth year], 69 y.o.   MRN: [id number]  HPI Patient is here today for complete physical exam.  Patient sees a physician with [facility] and recently received communication from their office stating that they wanted him to make an appointment to discuss his colonoscopy.  He plans to schedule this himself.  He also sees Dr. Jeffie Pollock with urology who manages his hypogonadism and screenings for prostate cancer.  He schedules this appointment himself.  His immunizations are up-to-date.  He denies any memory loss.  He denies any falls.  He denies any depression or anhedonia.  He is not exercising.  His BMI is elevated at 38.  Blood pressure today is outstanding at 130/78. Immunization History  Administered Date(s) Administered   Fluad Quad(high Dose 65+) 05/20/2019   Influenza,inj,Quad PF,6+ Mos 06/14/2017, 06/11/2018   Influenza-Unspecified 06/29/2015, 07/06/2016   PFIZER SARS-COV-2 Vaccination 10/31/2019, 11/23/2019   Pneumococcal Conjugate-13 03/27/2019   Pneumococcal Polysaccharide-23 12/25/2013, 02/26/2018   Tdap 02/26/2018   Zoster Recombinat (Shingrix) 03/26/2018, 05/28/2018    Past Medical History:  Diagnosis Date   Asthma    BPH (benign prostatic hyperplasia)    Colon polyps    DDD (degenerative disc disease), lumbar    L4-5   GERD (gastroesophageal reflux disease)    Prediabetes    Sleep apnea    Past Surgical History:  Procedure Laterality Date   VASECTOMY N/A    Phreesia 03/31/2020   Current Outpatient Medications on File Prior to Visit  Medication Sig Dispense Refill   albuterol (PROAIR HFA) 108 (90 Base) MCG/ACT inhaler INHALE 2 PUFFS INTO THE LUNGS EVERY 6 HOURS AS NEEDED FOR WHEEZING OR SHORTNESS OF BREATH 8.5 Inhaler 3   albuterol (PROVENTIL) (2.5 MG/3ML) 0.083% nebulizer solution Take 3 mLs (2.5 mg total) by nebulization every 6 (six) hours as needed for wheezing or shortness of breath. 150 mL 1     alfuzosin (UROXATRAL) 10 MG 24 hr tablet Take 10 mg by mouth daily. RX'd by Dr. Jeffie Pollock     atorvastatin (LIPITOR) 20 MG tablet TAKE 1 TABLET BY MOUTH EVERY DAY 90 tablet 1   esomeprazole (NEXIUM) 40 MG capsule TAKE 1 CAPSULE BY MOUTH EVERY DAY 30 capsule 3   montelukast (SINGULAIR) 10 MG tablet Take 1 tablet (10 mg total) by mouth at bedtime. 30 tablet 3   Respiratory Therapy Supplies (NEBULIZER/TUBING/MOUTHPIECE) KIT Disp one nebulizer machine, tubing set and mouthpiece kit 1 each 0   Testosterone 20.25 MG/ACT (1.62%) GEL RX'd by Dr. Jeffie Pollock     No current facility-administered medications on file prior to visit.   No Known Allergies Social History   Socioeconomic History   Marital status: Single    Spouse name: Not on file   Number of children: Not on file   Years of education: Not on file   Highest education level: Not on file  Occupational History   Not on file  Tobacco Use   Smoking status: Never Smoker   Smokeless tobacco: Never Used  Substance and Sexual Activity   Alcohol use: Yes    Comment: rare   Drug use: No   Sexual activity: Yes    Comment: married, Furniture conservator/restorer.  Other Topics Concern   Not on file  Social History Narrative   Not on file   Social Determinants of Health   Financial Resource Strain:    Difficulty of Paying Living Expenses:   Food Insecurity:  Worried About Charity fundraiser in the Last Year:    Arboriculturist in the Last Year:   Transportation Needs:    Film/video editor (Medical):    Lack of Transportation (Non-Medical):   Physical Activity:    Days of Exercise per Week:    Minutes of Exercise per Session:   Stress:    Feeling of Stress :   Social Connections:    Frequency of Communication with Friends and Family:    Frequency of Social Gatherings with Friends and Family:    Attends Religious Services:    Active Member of Clubs or Organizations:    Attends Music therapist:     Marital Status:   Intimate Partner Violence:    Fear of Current or Ex-Partner:    Emotionally Abused:    Physically Abused:    Sexually Abused:    Family History  Problem Relation Age of Onset   Diabetes Mother    Diabetes Maternal Grandmother        Review of Systems  All other systems reviewed and are negative.      Objective:   Physical Exam Vitals reviewed.  Constitutional:      General: He is not in acute distress.    Appearance: He is well-developed. He is not diaphoretic.  HENT:     Head: Normocephalic and atraumatic.     Right Ear: External ear normal.     Left Ear: External ear normal.     Nose: Nose normal.     Mouth/Throat:     Pharynx: No oropharyngeal exudate.  Eyes:     General: No scleral icterus.       Right eye: No discharge.        Left eye: No discharge.     Conjunctiva/sclera: Conjunctivae normal.     Pupils: Pupils are equal, round, and reactive to light.  Neck:     Thyroid: No thyromegaly.     Vascular: No JVD.     Trachea: No tracheal deviation.  Cardiovascular:     Rate and Rhythm: Normal rate and regular rhythm.     Heart sounds: Normal heart sounds. No murmur heard.  No friction rub. No gallop.   Pulmonary:     Effort: Pulmonary effort is normal. No respiratory distress.     Breath sounds: Normal breath sounds. No stridor. No wheezing or rales.  Chest:     Chest wall: No tenderness.  Abdominal:     General: Bowel sounds are normal. There is no distension.     Palpations: Abdomen is soft. There is no mass.     Tenderness: There is no abdominal tenderness. There is no guarding or rebound.  Musculoskeletal:        General: No tenderness. Normal range of motion.     Cervical back: Normal range of motion and neck supple.  Lymphadenopathy:     Cervical: No cervical adenopathy.  Skin:    General: Skin is warm.     Coloration: Skin is not pale.     Findings: No erythema or rash.  Neurological:     Mental Status: He is alert  and oriented to person, place, and time.     Cranial Nerves: No cranial nerve deficit.     Motor: No abnormal muscle tone.     Coordination: Coordination normal.     Deep Tendon Reflexes: Reflexes are normal and symmetric.  Psychiatric:        Behavior: Behavior normal.  Thought Content: Thought content normal.        Judgment: Judgment normal.           Assessment & Plan:  General medical exam  Prediabetes - Plan: Hemoglobin A1c, CBC with Differential/Platelet, COMPLETE METABOLIC PANEL WITH GFR, Lipid panel, Microalbumin, urine  Pure hypercholesterolemia - Plan: Hemoglobin A1c, CBC with Differential/Platelet, COMPLETE METABOLIC PANEL WITH GFR, Lipid panel, Microalbumin, urine  Sleep apnea, unspecified type  Patient's physical exam today is significant only for an elevated BMI.  I believe this is likely contributing to his prediabetes along with his hyperlipidemia.  I believe this also contributes to his chronic cough and worsens his asthma.  I have recommended that he gradually begin exercising and slowly work up to 20 to 30 minutes a day 5 days a week.  I cautioned the patient about going too fast too quickly and monitoring for any chest pain or angina.  Patient will schedule his own colonoscopy with his gastroenterologist and also his own follow-up with his urologist for his PSA.  His immunizations are up-to-date.  Return fasting for CBC, CMP, fasting lipid panel, and a hemoglobin A1c.  Patient denies any depression, falls, or memory loss

## 2020-04-02 ENCOUNTER — Other Ambulatory Visit: Payer: Medicare Other

## 2020-04-03 LAB — LIPID PANEL
Cholesterol: 138 mg/dL (ref <200)
HDL: 41 mg/dL (ref >=40)
LDL Cholesterol (Calc): 78 mg/dL (calc)
Non-HDL Cholesterol (Calc): 97 mg/dL (calc) (ref <130)
Total CHOL/HDL Ratio: 3.4 (calc) (ref <5.0)
Triglycerides: 102 mg/dL (ref <150)

## 2020-04-03 LAB — COMPLETE METABOLIC PANEL WITH GFR
AG Ratio: 1.6 (calc) (ref 1.0–2.5)
ALT: 34 U/L (ref 9–46)
AST: 17 U/L (ref 10–35)
Albumin: 4.2 g/dL (ref 3.6–5.1)
Alkaline phosphatase (APISO): 78 U/L (ref 35–144)
BUN: 16 mg/dL (ref 7–25)
CO2: 27 mmol/L (ref 20–32)
Calcium: 9.4 mg/dL (ref 8.6–10.3)
Chloride: 103 mmol/L (ref 98–110)
Creat: 1.25 mg/dL (ref 0.70–1.25)
GFR, Est African American: 69 mL/min/{1.73_m2} (ref >=60)
GFR, Est Non African American: 59 mL/min/{1.73_m2} — ABNORMAL LOW (ref >=60)
Globulin: 2.6 g/dL (calc) (ref 1.9–3.7)
Glucose, Bld: 132 mg/dL — ABNORMAL HIGH (ref 65–99)
Potassium: 4.5 mmol/L (ref 3.5–5.3)
Sodium: 142 mmol/L (ref 135–146)
Total Bilirubin: 1.3 mg/dL — ABNORMAL HIGH (ref 0.2–1.2)
Total Protein: 6.8 g/dL (ref 6.1–8.1)

## 2020-04-03 LAB — CBC WITH DIFFERENTIAL/PLATELET
Absolute Monocytes: 790 cells/uL (ref 200–950)
Basophils Absolute: 67 cells/uL (ref 0–200)
Basophils Relative: 0.8 %
Eosinophils Absolute: 126 cells/uL (ref 15–500)
Eosinophils Relative: 1.5 %
HCT: 47.6 % (ref 38.5–50.0)
Hemoglobin: 15.4 g/dL (ref 13.2–17.1)
Lymphs Abs: 2285 cells/uL (ref 850–3900)
MCH: 29.6 pg (ref 27.0–33.0)
MCHC: 32.4 g/dL (ref 32.0–36.0)
MCV: 91.5 fL (ref 80.0–100.0)
MPV: 9.9 fL (ref 7.5–12.5)
Monocytes Relative: 9.4 %
Neutro Abs: 5132 cells/uL (ref 1500–7800)
Neutrophils Relative %: 61.1 %
Platelets: 278 10*3/uL (ref 140–400)
RBC: 5.2 10*6/uL (ref 4.20–5.80)
RDW: 12.1 % (ref 11.0–15.0)
Total Lymphocyte: 27.2 %
WBC: 8.4 10*3/uL (ref 3.8–10.8)

## 2020-04-03 LAB — MICROALBUMIN, URINE: Microalb, Ur: 1.2 mg/dL

## 2020-04-03 LAB — HEMOGLOBIN A1C
Hgb A1c MFr Bld: 6.8 % of total Hgb — ABNORMAL HIGH (ref <5.7)
Mean Plasma Glucose: 148 (calc)
eAG (mmol/L): 8.2 (calc)

## 2020-04-07 ENCOUNTER — Ambulatory Visit
Admission: RE | Admit: 2020-04-07 | Discharge: 2020-04-07 | Disposition: A | Discharge status: Home / Self Care | Payer: Medicare Other | Source: Ambulatory Visit | Attending: Orthopedic Surgery | Admitting: Orthopedic Surgery

## 2020-04-07 DIAGNOSIS — R609 Edema, unspecified: Secondary | ICD-10-CM

## 2020-04-07 DIAGNOSIS — R531 Weakness: Secondary | ICD-10-CM

## 2020-04-07 DIAGNOSIS — R52 Pain, unspecified: Secondary | ICD-10-CM

## 2020-04-19 ENCOUNTER — Other Ambulatory Visit: Payer: Self-pay | Admitting: Family Medicine

## 2020-04-19 DIAGNOSIS — R059 Cough, unspecified: Secondary | ICD-10-CM

## 2020-04-19 DIAGNOSIS — J45901 Unspecified asthma with (acute) exacerbation: Secondary | ICD-10-CM

## 2020-04-19 DIAGNOSIS — R05 Cough: Secondary | ICD-10-CM

## 2020-04-19 DIAGNOSIS — J4520 Mild intermittent asthma, uncomplicated: Secondary | ICD-10-CM

## 2020-04-19 MED ORDER — ALBUTEROL SULFATE HFA 108 (90 BASE) MCG/ACT IN AERS: INHALATION_SPRAY | RESPIRATORY_TRACT | 11 refills | Status: DC

## 2020-04-19 MED ORDER — MONTELUKAST SODIUM 10 MG PO TABS: 10.0000 mg | ORAL_TABLET | Freq: Every day | ORAL | 3 refills | Status: DC

## 2020-04-19 NOTE — Telephone Encounter (Signed)
Prescription sent to pharmacy for routine medications.   Ok to refill Hycodan??  Last office visit 04/01/2020.  Last refill 12/17/20219.

## 2020-04-19 NOTE — Telephone Encounter (Signed)
Pt needs refill on albuterol (PROAIR HFA) 108 (90 Base) MCG/ACT inhaler,  montelukast (SINGULAIR) 10 MG tablet, Hydrocodone 1.5 mg/70ml syrup   Pt call back [phone]

## 2020-04-21 ENCOUNTER — Other Ambulatory Visit: Payer: Self-pay

## 2020-06-09 ENCOUNTER — Other Ambulatory Visit: Payer: Self-pay

## 2020-06-09 ENCOUNTER — Ambulatory Visit (INDEPENDENT_AMBULATORY_CARE_PROVIDER_SITE_OTHER): Payer: Medicare Other

## 2020-06-09 DIAGNOSIS — Z23 Encounter for immunization: Secondary | ICD-10-CM

## 2020-07-11 ENCOUNTER — Other Ambulatory Visit: Payer: Self-pay | Admitting: Family Medicine

## 2020-07-11 DIAGNOSIS — J45901 Unspecified asthma with (acute) exacerbation: Secondary | ICD-10-CM

## 2020-07-27 ENCOUNTER — Other Ambulatory Visit: Payer: Self-pay | Admitting: Family Medicine

## 2020-09-30 ENCOUNTER — Other Ambulatory Visit: Payer: Self-pay | Admitting: Family Medicine

## 2020-10-05 ENCOUNTER — Other Ambulatory Visit: Payer: Medicare Other

## 2020-10-12 ENCOUNTER — Ambulatory Visit (INDEPENDENT_AMBULATORY_CARE_PROVIDER_SITE_OTHER): Payer: Medicare Other | Admitting: Family Medicine

## 2020-10-12 ENCOUNTER — Encounter: Payer: Self-pay | Admitting: Family Medicine

## 2020-10-12 ENCOUNTER — Other Ambulatory Visit: Payer: Self-pay

## 2020-10-12 ENCOUNTER — Other Ambulatory Visit: Payer: Medicare Other

## 2020-10-12 VITALS — BP 136/68 | HR 98 | Temp 98.1°F | Wt 281.0 lb

## 2020-10-12 DIAGNOSIS — E118 Type 2 diabetes mellitus with unspecified complications: Secondary | ICD-10-CM

## 2020-10-12 DIAGNOSIS — E7800 Pure hypercholesterolemia, unspecified: Secondary | ICD-10-CM | POA: Diagnosis not present

## 2020-10-12 DIAGNOSIS — M67912 Unspecified disorder of synovium and tendon, left shoulder: Secondary | ICD-10-CM

## 2020-10-12 NOTE — Progress Notes (Signed)
Subjective:    Patient ID: Melvin Morrison, male    DOB: December 17, 1950, 70 y.o.   MRN: [id number]  HPI  In July, A1c was found to have risen to 6.8 indicating his prediabetes had progressed to diabetes mellitus type 2.  Friday he slipped and fell.  He landed on his right shoulder however he caught himself with his left arm as he was falling.  Since that time he has had severe pain in his left shoulder.  He is unable to abduct his shoulder greater than 60 degrees due to pain in the subacromial space.  His shoulder abduction is extremely weak.  He is physically unable to lift his shoulder against gravity.  He has a positive drop sign.  He is unable to even resist me with empty can testing.  Passively I am able to abduct his shoulder to 100 degrees before pain sets in.  He has no crepitus with range of motion although he does have pain.  The shoulder is not dislocated.  He admits that he is not been eating a low carbohydrate diet or exercising. Past Medical History:  Diagnosis Date  . Asthma   . BPH (benign prostatic hyperplasia)   . Colon polyps   . DDD (degenerative disc disease), lumbar    L4-5  . GERD (gastroesophageal reflux disease)   . Prediabetes   . Sleep apnea    Past Surgical History:  Procedure Laterality Date  . VASECTOMY N/A    Phreesia 03/31/2020   Current Outpatient Medications on File Prior to Visit  Medication Sig Dispense Refill  . albuterol (PROAIR HFA) 108 (90 Base) MCG/ACT inhaler INHALE 2 PUFFS INTO THE LUNGS EVERY 6 HOURS AS NEEDED FOR WHEEZING OR SHORTNESS OF BREATH 8.5 g 11  . albuterol (PROVENTIL) (2.5 MG/3ML) 0.083% nebulizer solution Take 3 mLs (2.5 mg total) by nebulization every 6 (six) hours as needed for wheezing or shortness of breath. 150 mL 1  . alfuzosin (UROXATRAL) 10 MG 24 hr tablet Take 10 mg by mouth daily. VH'Q by Dr. Jeffie Pollock    . atorvastatin (LIPITOR) 20 MG tablet TAKE 1 TABLET BY MOUTH EVERY DAY 90 tablet 1  . esomeprazole (NEXIUM) 40 MG capsule  TAKE 1 CAPSULE BY MOUTH EVERY DAY 90 capsule 3  . montelukast (SINGULAIR) 10 MG tablet TAKE 1 TABLET BY MOUTH EVERYDAY AT BEDTIME 90 tablet 1  . Respiratory Therapy Supplies (NEBULIZER/TUBING/MOUTHPIECE) KIT Disp one nebulizer machine, tubing set and mouthpiece kit 1 each 0  . Testosterone 20.25 MG/ACT (1.62%) GEL RX'd by Dr. Jeffie Pollock     No current facility-administered medications on file prior to visit.   No Known Allergies Social History   Socioeconomic History  . Marital status: Single    Spouse name: Not on file  . Number of children: Not on file  . Years of education: Not on file  . Highest education level: Not on file  Occupational History  . Not on file  Tobacco Use  . Smoking status: Never Smoker  . Smokeless tobacco: Never Used  Substance and Sexual Activity  . Alcohol use: Yes    Comment: rare  . Drug use: No  . Sexual activity: Yes    Comment: married, Furniture conservator/restorer.  Other Topics Concern  . Not on file  Social History Narrative  . Not on file   Social Determinants of Health   Financial Resource Strain: Not on file  Food Insecurity: Not on file  Transportation Needs: Not on file  Physical Activity:  Not on file  Stress: Not on file  Social Connections: Not on file  Intimate Partner Violence: Not on file     Review of Systems  All other systems reviewed and are negative.      Objective:   Physical Exam Vitals reviewed.  Constitutional:      Appearance: Normal appearance. He is obese.  Neck:     Vascular: No carotid bruit.  Cardiovascular:     Rate and Rhythm: Normal rate and regular rhythm.     Heart sounds: Normal heart sounds.  Pulmonary:     Effort: Pulmonary effort is normal.     Breath sounds: Normal breath sounds.  Musculoskeletal:     Left shoulder: Tenderness present. No swelling, deformity, effusion, bony tenderness or crepitus. Decreased range of motion. Decreased strength.     Cervical back: Normal range of motion and neck supple. No  rigidity or tenderness.  Neurological:     General: No focal deficit present.     Mental Status: He is alert and oriented to person, place, and time.     Cranial Nerves: No cranial nerve deficit.     Sensory: No sensory deficit.     Motor: No weakness.     Coordination: Coordination normal.     Gait: Gait normal.     Deep Tendon Reflexes: Reflexes normal.           Assessment & Plan:  Diabetes mellitus type 2 with complications (HCC) - Plan: Hemoglobin A1c, CBC with Differential/Platelet, COMPLETE METABOLIC PANEL WITH GFR, Lipid panel, Microalbumin, urine  Pure hypercholesterolemia - Plan: Hemoglobin A1c, CBC with Differential/Platelet, COMPLETE METABOLIC PANEL WITH GFR, Lipid panel, Microalbumin, urine  Rotator cuff disorder, left  I am very concerned that the patient may have torn his supraspinatus muscle.  He has no strength to abduct his shoulder against gravity or resistance.  He would like to try cortisone injection to see if this will alleviate the pain.  Using sterile technique, I injected the left subacromial space with 2 cc lidocaine, 2 cc of Marcaine, and 2 cc of 40 mg/mL Kenalog.  The patient tolerated the procedure well without complication.  Blood pressures acceptable today.  I recommended a low carbohydrate diet.  Less than 45 g of carbs per day.  Also recommended 30 minutes a day of aerobic exercise.  Check hemoglobin A1c.  Goal hemoglobin A1c is less than 6.5.  We also discussed adding a low-dose ACE inhibitor for renal protection.  He is already on a statin.  I will check a fasting lipid panel.  Goal LDL cholesterol is less than 100

## 2020-10-13 LAB — LIPID PANEL
Cholesterol: 143 mg/dL (ref <200)
HDL: 41 mg/dL (ref >=40)
LDL Cholesterol (Calc): 81 mg/dL (calc)
Non-HDL Cholesterol (Calc): 102 mg/dL (calc) (ref <130)
Total CHOL/HDL Ratio: 3.5 (calc) (ref <5.0)
Triglycerides: 110 mg/dL (ref <150)

## 2020-10-13 LAB — COMPLETE METABOLIC PANEL WITH GFR
AG Ratio: 1.6 (calc) (ref 1.0–2.5)
ALT: 29 U/L (ref 9–46)
AST: 16 U/L (ref 10–35)
Albumin: 4.3 g/dL (ref 3.6–5.1)
Alkaline phosphatase (APISO): 85 U/L (ref 35–144)
BUN: 21 mg/dL (ref 7–25)
CO2: 24 mmol/L (ref 20–32)
Calcium: 9.7 mg/dL (ref 8.6–10.3)
Chloride: 103 mmol/L (ref 98–110)
Creat: 0.97 mg/dL (ref 0.70–1.25)
GFR, Est African American: 93 mL/min/{1.73_m2} (ref >=60)
GFR, Est Non African American: 80 mL/min/{1.73_m2} (ref >=60)
Globulin: 2.7 g/dL (calc) (ref 1.9–3.7)
Glucose, Bld: 158 mg/dL — ABNORMAL HIGH (ref 65–99)
Potassium: 4.4 mmol/L (ref 3.5–5.3)
Sodium: 138 mmol/L (ref 135–146)
Total Bilirubin: 1.2 mg/dL (ref 0.2–1.2)
Total Protein: 7 g/dL (ref 6.1–8.1)

## 2020-10-13 LAB — CBC WITH DIFFERENTIAL/PLATELET
Absolute Monocytes: 810 cells/uL (ref 200–950)
Basophils Absolute: 49 cells/uL (ref 0–200)
Basophils Relative: 0.6 %
Eosinophils Absolute: 170 cells/uL (ref 15–500)
Eosinophils Relative: 2.1 %
HCT: 48.6 % (ref 38.5–50.0)
Hemoglobin: 16.3 g/dL (ref 13.2–17.1)
Lymphs Abs: 1693 cells/uL (ref 850–3900)
MCH: 30.1 pg (ref 27.0–33.0)
MCHC: 33.5 g/dL (ref 32.0–36.0)
MCV: 89.7 fL (ref 80.0–100.0)
MPV: 10 fL (ref 7.5–12.5)
Monocytes Relative: 10 %
Neutro Abs: 5378 cells/uL (ref 1500–7800)
Neutrophils Relative %: 66.4 %
Platelets: 272 10*3/uL (ref 140–400)
RBC: 5.42 10*6/uL (ref 4.20–5.80)
RDW: 12.1 % (ref 11.0–15.0)
Total Lymphocyte: 20.9 %
WBC: 8.1 10*3/uL (ref 3.8–10.8)

## 2020-10-13 LAB — HEMOGLOBIN A1C
Hgb A1c MFr Bld: 7.5 % of total Hgb — ABNORMAL HIGH (ref <5.7)
Mean Plasma Glucose: 169 mg/dL
eAG (mmol/L): 9.3 mmol/L

## 2020-10-13 LAB — MICROALBUMIN, URINE: Microalb, Ur: 2.2 mg/dL

## 2020-10-18 ENCOUNTER — Other Ambulatory Visit: Payer: Self-pay | Admitting: Family Medicine

## 2020-10-18 ENCOUNTER — Other Ambulatory Visit: Payer: Self-pay

## 2020-10-18 ENCOUNTER — Telehealth: Payer: Self-pay | Admitting: Family Medicine

## 2020-10-18 DIAGNOSIS — M67912 Unspecified disorder of synovium and tendon, left shoulder: Secondary | ICD-10-CM

## 2020-10-18 DIAGNOSIS — E118 Type 2 diabetes mellitus with unspecified complications: Secondary | ICD-10-CM

## 2020-10-18 MED ORDER — METFORMIN HCL 1000 MG PO TABS: ORAL_TABLET | ORAL | 1 refills | Status: DC

## 2020-10-18 MED ORDER — METFORMIN HCL ER 500 MG PO TB24: ORAL_TABLET | ORAL | 3 refills | Status: DC

## 2020-10-18 NOTE — Telephone Encounter (Signed)
Pt called for f/u call with Dr. Tanya Nones about cortizone shot he received last week not working for the pain. Also wanting to be referred to [facility]. Wanting to know about new meds for blood pressure. Cb# [phone]

## 2020-10-19 NOTE — Telephone Encounter (Signed)
Pt calling back to make sure the [facility] referral will be sent to [facility].

## 2020-10-19 NOTE — Telephone Encounter (Signed)
Referral has already been placed.

## 2020-11-04 ENCOUNTER — Other Ambulatory Visit (HOSPITAL_COMMUNITY): Payer: Self-pay | Admitting: Internal Medicine

## 2020-11-04 ENCOUNTER — Other Ambulatory Visit (HOSPITAL_COMMUNITY): Payer: Self-pay | Admitting: Orthopedic Surgery

## 2020-11-04 DIAGNOSIS — M25512 Pain in left shoulder: Secondary | ICD-10-CM

## 2020-11-17 ENCOUNTER — Ambulatory Visit (HOSPITAL_COMMUNITY)
Admission: RE | Admit: 2020-11-17 | Discharge: 2020-11-17 | Disposition: A | Discharge status: Home / Self Care | Payer: Medicare Other | Source: Ambulatory Visit | Attending: Orthopedic Surgery | Admitting: Orthopedic Surgery

## 2020-11-17 ENCOUNTER — Other Ambulatory Visit: Payer: Self-pay

## 2020-11-17 DIAGNOSIS — M25512 Pain in left shoulder: Secondary | ICD-10-CM | POA: Diagnosis present

## 2020-12-01 ENCOUNTER — Other Ambulatory Visit: Payer: Self-pay | Admitting: Family Medicine

## 2020-12-01 DIAGNOSIS — J45901 Unspecified asthma with (acute) exacerbation: Secondary | ICD-10-CM

## 2021-01-20 ENCOUNTER — Ambulatory Visit: Payer: Medicare Other | Admitting: Family Medicine

## 2021-01-20 ENCOUNTER — Other Ambulatory Visit: Payer: Self-pay

## 2021-01-20 ENCOUNTER — Other Ambulatory Visit: Payer: Medicare Other

## 2021-01-20 DIAGNOSIS — E7800 Pure hypercholesterolemia, unspecified: Secondary | ICD-10-CM

## 2021-01-20 DIAGNOSIS — Z13220 Encounter for screening for lipoid disorders: Secondary | ICD-10-CM

## 2021-01-20 DIAGNOSIS — E118 Type 2 diabetes mellitus with unspecified complications: Secondary | ICD-10-CM

## 2021-01-21 LAB — LIPID PANEL
Cholesterol: 138 mg/dL (ref <200)
HDL: 35 mg/dL — ABNORMAL LOW (ref >=40)
LDL Cholesterol (Calc): 81 mg/dL (calc)
Non-HDL Cholesterol (Calc): 103 mg/dL (calc) (ref <130)
Total CHOL/HDL Ratio: 3.9 (calc) (ref <5.0)
Triglycerides: 123 mg/dL (ref <150)

## 2021-01-21 LAB — CBC WITH DIFFERENTIAL/PLATELET
Absolute Monocytes: 677 cells/uL (ref 200–950)
Basophils Absolute: 50 cells/uL (ref 0–200)
Basophils Relative: 0.7 %
Eosinophils Absolute: 151 cells/uL (ref 15–500)
Eosinophils Relative: 2.1 %
HCT: 43.7 % (ref 38.5–50.0)
Hemoglobin: 14.8 g/dL (ref 13.2–17.1)
Lymphs Abs: 1872 cells/uL (ref 850–3900)
MCH: 31 pg (ref 27.0–33.0)
MCHC: 33.9 g/dL (ref 32.0–36.0)
MCV: 91.4 fL (ref 80.0–100.0)
MPV: 9.9 fL (ref 7.5–12.5)
Monocytes Relative: 9.4 %
Neutro Abs: 4450 cells/uL (ref 1500–7800)
Neutrophils Relative %: 61.8 %
Platelets: 255 10*3/uL (ref 140–400)
RBC: 4.78 10*6/uL (ref 4.20–5.80)
RDW: 12.1 % (ref 11.0–15.0)
Total Lymphocyte: 26 %
WBC: 7.2 10*3/uL (ref 3.8–10.8)

## 2021-01-21 LAB — COMPLETE METABOLIC PANEL WITH GFR
AG Ratio: 1.7 (calc) (ref 1.0–2.5)
ALT: 22 U/L (ref 9–46)
AST: 12 U/L (ref 10–35)
Albumin: 4 g/dL (ref 3.6–5.1)
Alkaline phosphatase (APISO): 70 U/L (ref 35–144)
BUN: 15 mg/dL (ref 7–25)
CO2: 26 mmol/L (ref 20–32)
Calcium: 8.9 mg/dL (ref 8.6–10.3)
Chloride: 102 mmol/L (ref 98–110)
Creat: 1.18 mg/dL (ref 0.70–1.25)
GFR, Est African American: 73 mL/min/{1.73_m2} (ref >=60)
GFR, Est Non African American: 63 mL/min/{1.73_m2} (ref >=60)
Globulin: 2.4 g/dL (calc) (ref 1.9–3.7)
Glucose, Bld: 165 mg/dL — ABNORMAL HIGH (ref 65–99)
Potassium: 4 mmol/L (ref 3.5–5.3)
Sodium: 141 mmol/L (ref 135–146)
Total Bilirubin: 0.7 mg/dL (ref 0.2–1.2)
Total Protein: 6.4 g/dL (ref 6.1–8.1)

## 2021-01-21 LAB — HEMOGLOBIN A1C
Hgb A1c MFr Bld: 6.7 % of total Hgb — ABNORMAL HIGH (ref <5.7)
Mean Plasma Glucose: 146 mg/dL
eAG (mmol/L): 8.1 mmol/L

## 2021-01-24 ENCOUNTER — Other Ambulatory Visit: Payer: Self-pay | Admitting: Family Medicine

## 2021-01-26 ENCOUNTER — Encounter: Payer: Self-pay | Admitting: *Deleted

## 2021-02-07 ENCOUNTER — Ambulatory Visit: Payer: Medicare Other | Admitting: Podiatry

## 2021-02-07 ENCOUNTER — Other Ambulatory Visit: Payer: Self-pay

## 2021-02-07 ENCOUNTER — Ambulatory Visit (INDEPENDENT_AMBULATORY_CARE_PROVIDER_SITE_OTHER): Payer: Medicare Other

## 2021-02-07 DIAGNOSIS — M7751 Other enthesopathy of right foot: Secondary | ICD-10-CM

## 2021-02-07 MED ORDER — BETAMETHASONE SOD PHOS & ACET 6 (3-3) MG/ML IJ SUSP
3.0000 mg | Freq: Once | INTRAMUSCULAR | Status: AC
  Administered 2021-02-07: 3 mg via INTRA_ARTICULAR

## 2021-02-07 NOTE — Progress Notes (Signed)
   HPI: 70 y.o. male presenting today for new complaint regarding right fifth toe pain that began when the patient sustained a trip and fall injury to the right foot on the ice on 09/24/2020.  Patient states that he injured his left shoulder and has been going to physical therapy with improvement.  He states that he has some right forefoot pain that has been nagging.  There has been improvement over the last 6 months but he continues to have some mild intermittent pain.  Past Medical History:  Diagnosis Date  . Asthma   . BPH (benign prostatic hyperplasia)   . Colon polyps   . DDD (degenerative disc disease), lumbar    L4-5  . Diabetes mellitus without complication (HCC)   . GERD (gastroesophageal reflux disease)   . Sleep apnea      Physical Exam: General: The patient is alert and oriented x3 in no acute distress.  Dermatology: Skin is warm, dry and supple bilateral lower extremities. Negative for open lesions or macerations.  Vascular: Palpable pedal pulses bilaterally. No edema or erythema noted. Capillary refill within normal limits.  Neurological: Epicritic and protective threshold grossly intact bilaterally.   Musculoskeletal Exam: Range of motion within normal limits to all pedal and ankle joints bilateral. Muscle strength 5/5 in all groups bilateral.  Pain on palpation and range of motion with the fifth MTPJ right foot  Radiographic Exam:  Normal osseous mineralization. Joint spaces preserved. No fracture/dislocation/boney destruction.    Assessment: 1.  Fifth MTPJ capsulitis right   Plan of Care:  1. Patient evaluated. X-Rays reviewed.  2.  Injection of 0.5 cc Celestone Soluspan injection of the fifth MTPJ right 3.  Continue custom molded orthotics 4.  Recommend good supportive shoes and sneakers 5.  Return to clinic as needed      Felecia Shelling, DPM [facility]  Dr. Felecia Shelling, DPM    [address] [place], [place] [address]                Office [phone]  Fax [phone]

## 2021-02-15 LAB — HM DIABETES EYE EXAM

## 2021-02-22 ENCOUNTER — Encounter: Payer: Self-pay | Admitting: *Deleted

## 2021-04-07 ENCOUNTER — Other Ambulatory Visit: Payer: Self-pay

## 2021-04-07 ENCOUNTER — Ambulatory Visit (INDEPENDENT_AMBULATORY_CARE_PROVIDER_SITE_OTHER): Payer: Medicare Other | Admitting: Family Medicine

## 2021-04-07 ENCOUNTER — Encounter: Payer: Self-pay | Admitting: Family Medicine

## 2021-04-07 VITALS — BP 132/64 | HR 82 | Temp 97.9°F | Resp 14 | Ht 72.0 in | Wt 287.0 lb

## 2021-04-07 DIAGNOSIS — Z0000 Encounter for general adult medical examination without abnormal findings: Secondary | ICD-10-CM | POA: Diagnosis not present

## 2021-04-07 DIAGNOSIS — E7800 Pure hypercholesterolemia, unspecified: Secondary | ICD-10-CM | POA: Diagnosis not present

## 2021-04-07 DIAGNOSIS — E118 Type 2 diabetes mellitus with unspecified complications: Secondary | ICD-10-CM | POA: Diagnosis not present

## 2021-04-07 MED ORDER — CLOTRIMAZOLE-BETAMETHASONE 1-0.05 % EX CREA: 1.0000 "application " | TOPICAL_CREAM | Freq: Two times a day (BID) | CUTANEOUS | 0 refills | Status: DC

## 2021-04-07 NOTE — Progress Notes (Signed)
Subjective:    Patient ID: Melvin Morrison, male    DOB: 1951/07/08, 70 y.o.   MRN: [id number]  HPI Patient is here today for complete physical exam.  He also sees Dr. Jeffie Pollock with urology who manages his hypogonadism and screenings for prostate cancer.  He schedules this appointment himself.  He usually sees his urologist in October.  He states that 1 or 2 years ago he had a colonoscopy at [facility].  They found 3 polyps and recommended a repeat colonoscopy in 5 years.  Therefore he believes that he is due for this in 3 years.  Diabetic eye exam was performed earlier this year.  He is concerned about a rash on his upper medial thighs adjacent to his scrotum.  This is located bilaterally.  Has a lens shape pattern suggesting intertrigo.  His immunizations are up-to-date.  He denies any memory loss.  He denies any falls.  He denies any depression or anhedonia.   Immunization History  Administered Date(s) Administered   Fluad Quad(high Dose 65+) 05/20/2019, 06/09/2020   Influenza,inj,Quad PF,6+ Mos 06/14/2017, 06/11/2018   Influenza-Unspecified 06/29/2015, 07/06/2016   PFIZER(Purple Top)SARS-COV-2 Vaccination 10/31/2019, 11/23/2019   Pneumococcal Conjugate-13 03/27/2019   Pneumococcal Polysaccharide-23 12/25/2013, 02/26/2018   Tdap 02/26/2018   Zoster Recombinat (Shingrix) 03/26/2018, 05/28/2018   Abstract on 02/22/2021  Component Date Value Ref Range Status   HM Diabetic Eye Exam 02/15/2021 No Retinopathy  No Retinopathy Final  Lab on 01/20/2021  Component Date Value Ref Range Status   WBC 01/20/2021 7.2  3.8 - 10.8 Thousand/uL Final   RBC 01/20/2021 4.78  4.20 - 5.80 Million/uL Final   Hemoglobin 01/20/2021 14.8  13.2 - 17.1 g/dL Final   HCT 01/20/2021 43.7  38.5 - 50.0 % Final   MCV 01/20/2021 91.4  80.0 - 100.0 fL Final   MCH 01/20/2021 31.0  27.0 - 33.0 pg Final   MCHC 01/20/2021 33.9  32.0 - 36.0 g/dL Final   RDW 01/20/2021 12.1  11.0 - 15.0 % Final   Platelets 01/20/2021 255  140 -  400 Thousand/uL Final   MPV 01/20/2021 9.9  7.5 - 12.5 fL Final   Neutro Abs 01/20/2021 4,450  1,500 - 7,800 cells/uL Final   Lymphs Abs 01/20/2021 1,872  850 - 3,900 cells/uL Final   Absolute Monocytes 01/20/2021 677  200 - 950 cells/uL Final   Eosinophils Absolute 01/20/2021 151  15 - 500 cells/uL Final   Basophils Absolute 01/20/2021 50  0 - 200 cells/uL Final   Neutrophils Relative % 01/20/2021 61.8  % Final   Total Lymphocyte 01/20/2021 26.0  % Final   Monocytes Relative 01/20/2021 9.4  % Final   Eosinophils Relative 01/20/2021 2.1  % Final   Basophils Relative 01/20/2021 0.7  % Final   Glucose, Bld 01/20/2021 165 (A) 65 - 99 mg/dL Final   Comment: .            Fasting reference interval . For someone without known diabetes, a glucose value >125 mg/dL indicates that they may have diabetes and this should be confirmed with a follow-up test. .    BUN 01/20/2021 15  7 - 25 mg/dL Final   Creat 01/20/2021 1.18  0.70 - 1.25 mg/dL Final   Comment: For patients >28 years of age, the reference limit for Creatinine is approximately 13% higher for people identified as African-American. .    GFR, Est Non African American 01/20/2021 63  > OR = 60 mL/min/1.57m Final   GFR,  Est African American 01/20/2021 73  > OR = 60 mL/min/1.50m Final   BUN/Creatinine Ratio 063/01/6010NOT APPLICABLE  6 - 22 (calc) Final   Sodium 01/20/2021 141  135 - 146 mmol/L Final   Potassium 01/20/2021 4.0  3.5 - 5.3 mmol/L Final   Chloride 01/20/2021 102  98 - 110 mmol/L Final   CO2 01/20/2021 26  20 - 32 mmol/L Final   Calcium 01/20/2021 8.9  8.6 - 10.3 mg/dL Final   Total Protein 01/20/2021 6.4  6.1 - 8.1 g/dL Final   Albumin 01/20/2021 4.0  3.6 - 5.1 g/dL Final   Globulin 01/20/2021 2.4  1.9 - 3.7 g/dL (calc) Final   AG Ratio 01/20/2021 1.7  1.0 - 2.5 (calc) Final   Total Bilirubin 01/20/2021 0.7  0.2 - 1.2 mg/dL Final   Alkaline phosphatase (APISO) 01/20/2021 70  35 - 144 U/L Final   AST 01/20/2021 12   10 - 35 U/L Final   ALT 01/20/2021 22  9 - 46 U/L Final   Hgb A1c MFr Bld 01/20/2021 6.7 (A) <5.7 % of total Hgb Final   Comment: For someone without known diabetes, a hemoglobin A1c value of 6.5% or greater indicates that they may have  diabetes and this should be confirmed with a follow-up  test. . For someone with known diabetes, a value <7% indicates  that their diabetes is well controlled and a value  greater than or equal to 7% indicates suboptimal  control. A1c targets should be individualized based on  duration of diabetes, age, comorbid conditions, and  other considerations. . Currently, no consensus exists regarding use of hemoglobin A1c for diagnosis of diabetes for children. .    Mean Plasma Glucose 01/20/2021 146  mg/dL Final   eAG (mmol/L) 01/20/2021 8.1  mmol/L Final   Cholesterol 01/20/2021 138  <200 mg/dL Final   HDL 01/20/2021 35 (A) > OR = 40 mg/dL Final   Triglycerides 01/20/2021 123  <150 mg/dL Final   LDL Cholesterol (Calc) 01/20/2021 81  mg/dL (calc) Final   Comment: Reference range: <100 . Desirable range <100 mg/dL for primary prevention;   <70 mg/dL for patients with CHD or diabetic patients  with > or = 2 CHD risk factors. .Marland KitchenLDL-C is now calculated using the Martin-Hopkins  calculation, which is a validated novel method providing  better accuracy than the Friedewald equation in the  estimation of LDL-C.  MCresenciano Genreet al. JAnnamaria Helling 29323;557(32: 2061-2068  (http://education.[facility].com/faq/FAQ164)    Total CHOL/HDL Ratio 01/20/2021 3.9  <5.0 (calc) Final   Non-HDL Cholesterol (Calc) 01/20/2021 103  <130 mg/dL (calc) Final   Comment: For patients with diabetes plus 1 major ASCVD risk  factor, treating to a non-HDL-C goal of <100 mg/dL  (LDL-C of <70 mg/dL) is considered a therapeutic  option.      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   Diabetes mellitus without complication (HCC)    GERD (gastroesophageal reflux disease)    Sleep apnea    Past Surgical History:  Procedure Laterality Date   VASECTOMY N/A    [employer]reesia 03/31/2020   Current Outpatient Medications on File Prior to Visit  Medication Sig Dispense Refill   albuterol (PROAIR HFA) 108 (90 Base) MCG/ACT inhaler INHALE 2 PUFFS INTO THE LUNGS EVERY 6 HOURS AS NEEDED FOR WHEEZING OR SHORTNESS OF BREATH  8.5 g 11   albuterol (PROVENTIL) (2.5 MG/3ML) 0.083% nebulizer solution Take 3 mLs (2.5 mg total) by nebulization every 6 (six) hours as needed for wheezing or shortness of breath. 150 mL 1   alfuzosin (UROXATRAL) 10 MG 24 hr tablet Take 10 mg by mouth daily. RX'd by Dr. Jeffie Pollock     atorvastatin (LIPITOR) 20 MG tablet TAKE 1 TABLET BY MOUTH EVERY DAY 90 tablet 1   esomeprazole (NEXIUM) 40 MG capsule TAKE 1 CAPSULE BY MOUTH EVERY DAY 90 capsule 3   metFORMIN (GLUCOPHAGE-XR) 500 MG 24 hr tablet TAKE 2 TABLETS( 1000MG) BY MOUTH EVERY MORNING FOR 2 WEEKS THEN INCREASE TO 4 DAILY 360 tablet 1   montelukast (SINGULAIR) 10 MG tablet TAKE 1 TABLET BY MOUTH EVERYDAY AT BEDTIME 90 tablet 1   Respiratory Therapy Supplies (NEBULIZER/TUBING/MOUTHPIECE) KIT Disp one nebulizer machine, tubing set and mouthpiece kit 1 each 0   Testosterone 20.25 MG/ACT (1.62%) GEL RX'd by Dr. Jeffie Pollock     No current facility-administered medications on file prior to visit.   No Known Allergies Social History   Socioeconomic History   Marital status: Single    Spouse name: Not on file   Number of children: Not on file   Years of education: Not on file   Highest education level: Not on file  Occupational History   Not on file  Tobacco Use   Smoking status: Never   Smokeless tobacco: Never  Substance and Sexual Activity   Alcohol use: Yes    Comment: rare   Drug use: No   Sexual activity: Yes    Comment: married, Furniture conservator/restorer.  Other Topics Concern   Not on file  Social History  Narrative   Not on file   Social Determinants of Health   Financial Resource Strain: Not on file  Food Insecurity: Not on file  Transportation Needs: Not on file  Physical Activity: Not on file  Stress: Not on file  Social Connections: Not on file  Intimate Partner Violence: Not on file   Family History  Problem Relation Age of Onset   Diabetes Mother    Diabetes Maternal Grandmother        Review of Systems     Objective:   Physical Exam Vitals reviewed.  Constitutional:      General: He is not in acute distress.    Appearance: Normal appearance. He is obese. He is not ill-appearing, toxic-appearing or diaphoretic.  HENT:     Head: Normocephalic and atraumatic.     Right Ear: Tympanic membrane, ear canal and external ear normal. There is no impacted cerumen.     Left Ear: Tympanic membrane, ear canal and external ear normal. There is no impacted cerumen.     Nose: Nose normal.     Mouth/Throat:     Mouth: Mucous membranes are moist.     Pharynx: Oropharynx is clear. No oropharyngeal exudate or posterior oropharyngeal erythema.  Eyes:     General: No scleral icterus.       Right eye: No discharge.        Left eye: No discharge.     Extraocular Movements: Extraocular movements intact.     Conjunctiva/sclera: Conjunctivae normal.     Pupils: Pupils are equal, round, and reactive to light.  Neck:     Vascular: No carotid bruit.  Cardiovascular:     Rate and Rhythm: Normal rate and regular rhythm.     Pulses: Normal pulses.     Heart sounds:  Normal heart sounds. No murmur heard.   No friction rub. No gallop.  Pulmonary:     Effort: Pulmonary effort is normal. No respiratory distress.     Breath sounds: Normal breath sounds. No stridor. No wheezing, rhonchi or rales.  Abdominal:     General: Abdomen is flat. Bowel sounds are normal. There is no distension.     Palpations: Abdomen is soft. There is no mass.     Tenderness: There is no abdominal tenderness. There  is no guarding or rebound.     Hernia: No hernia is present.  Musculoskeletal:     Cervical back: Normal range of motion and neck supple. No tenderness.     Right lower leg: No edema.     Left lower leg: No edema.  Skin:    Findings: Erythema and rash present.  Neurological:     General: No focal deficit present.     Mental Status: He is alert and oriented to person, place, and time. Mental status is at baseline.     Cranial Nerves: No cranial nerve deficit.     Sensory: No sensory deficit.     Motor: No weakness.     Coordination: Coordination normal.     Gait: Gait normal.     Deep Tendon Reflexes: Reflexes normal.  Psychiatric:        Mood and Affect: Mood normal.        Behavior: Behavior normal.        Thought Content: Thought content normal.        Judgment: Judgment normal.          Assessment & Plan:  General medical exam  Diabetes mellitus type 2 with complications ([place])  Pure hypercholesterolemia Diabetic eye exam was performed earlier this year and was normal.  His Asians are up-to-date.  He has had the shingles vaccine as well as 2 pneumonia vaccines and also for COVID vaccines.  Colonoscopy is reportedly up-to-date.  I will defer his PSA to his urologist.  A1c is acceptable at 6.7.  He has been unable to increase metformin 2000 mg twice a day due to diarrhea.  He states that he is only taking it approximately 50% of the time.  We discussed switching to other alternatives such as Lovie Macadamia, Trulicity, or Ozempic.  He will research this and determine the price.  My biggest concern for the patient at the present time is his weight.  Recommended exercise and weight loss as I do believe he is physically deconditioned.  The remainder of his lab work is acceptable.  He does have a rash on his upper medial thigh consistent with intertrigo.  Recommended Lotrisone cream twice daily for 2 weeks and then recommended strategies to help prevent recurrences in the future.

## 2021-04-12 ENCOUNTER — Telehealth: Payer: Self-pay

## 2021-04-12 ENCOUNTER — Other Ambulatory Visit: Payer: Self-pay

## 2021-04-12 MED ORDER — ATORVASTATIN CALCIUM 20 MG PO TABS: 20.0000 mg | ORAL_TABLET | Freq: Every day | ORAL | 1 refills | Status: DC

## 2021-04-12 NOTE — Telephone Encounter (Signed)
Pt's spouse called in requesting a refill of atorvastatin (LIPITOR) 20 MG tablet. Pt's wife stated that they were having an issue with the pharmacy to get this refilled ([facility] [place]). Pt's wife would like a call back about refill.  Cb#: [phone]

## 2021-04-12 NOTE — Telephone Encounter (Signed)
Medication sent.

## 2021-05-18 ENCOUNTER — Other Ambulatory Visit: Payer: Self-pay | Admitting: Family Medicine

## 2021-05-18 DIAGNOSIS — J45901 Unspecified asthma with (acute) exacerbation: Secondary | ICD-10-CM

## 2021-06-08 ENCOUNTER — Other Ambulatory Visit: Payer: Self-pay

## 2021-06-08 ENCOUNTER — Ambulatory Visit (INDEPENDENT_AMBULATORY_CARE_PROVIDER_SITE_OTHER): Payer: Medicare Other | Admitting: *Deleted

## 2021-06-08 DIAGNOSIS — Z23 Encounter for immunization: Secondary | ICD-10-CM

## 2021-08-04 ENCOUNTER — Other Ambulatory Visit: Payer: Self-pay | Admitting: Family Medicine

## 2021-10-05 ENCOUNTER — Other Ambulatory Visit: Payer: Self-pay | Admitting: Family Medicine

## 2021-10-31 ENCOUNTER — Other Ambulatory Visit: Payer: Self-pay | Admitting: Family Medicine

## 2021-10-31 DIAGNOSIS — J45901 Unspecified asthma with (acute) exacerbation: Secondary | ICD-10-CM

## 2022-01-04 DIAGNOSIS — E119 Type 2 diabetes mellitus without complications: Secondary | ICD-10-CM | POA: Insufficient documentation

## 2022-01-04 DIAGNOSIS — K219 Gastro-esophageal reflux disease without esophagitis: Secondary | ICD-10-CM | POA: Insufficient documentation

## 2022-01-04 DIAGNOSIS — E7800 Pure hypercholesterolemia, unspecified: Secondary | ICD-10-CM | POA: Insufficient documentation

## 2022-01-04 DIAGNOSIS — R053 Chronic cough: Secondary | ICD-10-CM | POA: Insufficient documentation

## 2022-01-16 ENCOUNTER — Ambulatory Visit (INDEPENDENT_AMBULATORY_CARE_PROVIDER_SITE_OTHER): Payer: Medicare Other

## 2022-01-16 ENCOUNTER — Ambulatory Visit: Payer: Medicare Other | Admitting: Podiatry

## 2022-01-16 DIAGNOSIS — R52 Pain, unspecified: Secondary | ICD-10-CM

## 2022-01-16 DIAGNOSIS — S9001XA Contusion of right ankle, initial encounter: Secondary | ICD-10-CM

## 2022-01-16 DIAGNOSIS — M7751 Other enthesopathy of right foot: Secondary | ICD-10-CM

## 2022-01-16 MED ORDER — BETAMETHASONE SOD PHOS & ACET 6 (3-3) MG/ML IJ SUSP
3.0000 mg | Freq: Once | INTRAMUSCULAR | Status: AC
  Administered 2022-01-16: 3 mg via INTRA_ARTICULAR

## 2022-01-16 MED ORDER — BETAMETHASONE SOD PHOS & ACET 6 (3-3) MG/ML IJ SUSP: 3.0000 mg | Freq: Once | INTRAMUSCULAR | Status: DC

## 2022-01-16 NOTE — Progress Notes (Signed)
? ?  Subjective:  ?71 y.o. male presenting today for a new complaint of pain to the right ankle.  New onset of pain.  He denies a history of injury.  This has been ongoing for about 2 months now.  He cannot recall an incident or injury that would have elicited the pain.  He has heard some popping noises in the past.  He presents for further treatment and evaluation ? ? ?Past Medical History:  ?Diagnosis Date  ? Asthma   ? BPH (benign prostatic hyperplasia)   ? Colon polyps   ? DDD (degenerative disc disease), lumbar   ? L4-5  ? Diabetes mellitus without complication ([place])   ? GERD (gastroesophageal reflux disease)   ? Sleep apnea   ? ?Past Surgical History:  ?Procedure Laterality Date  ? VASECTOMY N/A   ? [employer]reesia 03/31/2020  ? ?No Known Allergies ? ? ?Objective / Physical Exam:  ?General:  ?The patient is alert and oriented x3 in no acute distress. ?Dermatology:  ?Skin is warm, dry and supple bilateral lower extremities. Negative for open lesions or macerations. ?Vascular:  ?Palpable pedal pulses bilaterally. No edema or erythema noted. Capillary refill within normal limits. ?Neurological:  ?Epicritic and protective threshold grossly intact bilaterally.  ?Musculoskeletal Exam:  ?Pain on palpation to the anterior lateral medial aspects of the patient's right ankle. Mild edema noted. Range of motion within normal limits to all pedal and ankle joints bilateral. Muscle strength 5/5 in all groups bilateral.  ? ?Radiographic Exam:  ?Normal osseous mineralization. Joint spaces preserved. No fracture/dislocation/boney destruction.   ? ?Assessment: ?1. Capsulitis right ankle ? ?Plan of Care:  ?1. Patient was evaluated. X-Rays reviewed.  ?2. Injection of 0.5 mL Celestone Soluspan injected in the patient's right ankle. ?3. Continue custom molded orthotics.  ?4. Return to clinic PRN ? ? ?Edrick Kins, DPM ?[place] ? ?Dr. Edrick Kins, DPM  ?  ?[address], [place] [address]                ?Office [phone]  ?Fax [phone] ? ?

## 2022-01-18 ENCOUNTER — Telehealth: Payer: Self-pay | Admitting: Podiatry

## 2022-01-18 NOTE — Telephone Encounter (Signed)
Pt was seen in the office on 5/1 by Dr. Logan Bores. Wife called and stated that a RX for meloxicam was supposed to be called in for her husband. Pharmacy states there is no RX for him. [facility] [place]. Please advise. ?

## 2022-01-20 ENCOUNTER — Other Ambulatory Visit: Payer: Self-pay | Admitting: Podiatry

## 2022-01-20 MED ORDER — MELOXICAM 15 MG PO TABS: 15.0000 mg | ORAL_TABLET | Freq: Every day | ORAL | 1 refills | Status: DC

## 2022-01-20 NOTE — Telephone Encounter (Signed)
Rx sent. - Dr. Jaylene Arrowood

## 2022-03-17 ENCOUNTER — Other Ambulatory Visit: Payer: Self-pay | Admitting: Family Medicine

## 2022-03-17 NOTE — Telephone Encounter (Signed)
Requested medications are due for refill today.  yes  Requested medications are on the active medications list.  yes  Last refill. 10/05/2021 #90 1 refill  Future visit scheduled.   yes  Notes to clinic.  Labs are expired.    Requested Prescriptions  Pending Prescriptions Disp Refills   atorvastatin (LIPITOR) 20 MG tablet [Pharmacy Med Name: ATORVASTATIN 20 MG TABLET] 90 tablet 1    Sig: TAKE 1 TABLET BY MOUTH EVERY DAY     Cardiovascular:  Antilipid - Statins Failed - 03/17/2022  1:40 PM      Failed - Valid encounter within last 12 months    Recent Outpatient Visits           11 months ago General medical exam   [facility] Donita Brooks, MD   1 year ago Diabetes mellitus type 2 with complications (HCC)   [facility] Pickard, Priscille Heidelberg, MD   1 year ago General medical exam   [facility] Donita Brooks, MD   2 years ago Numbness and tingling in right hand   [facility] Pickard, Priscille Heidelberg, MD   2 years ago General medical exam   [facility] Donita Brooks, MD              Failed - Lipid Panel in normal range within the last 12 months    Cholesterol  Date Value Ref Range Status  01/20/2021 138 <200 mg/dL Final   LDL Cholesterol (Calc)  Date Value Ref Range Status  01/20/2021 81 mg/dL (calc) Final    Comment:    Reference range: <100 . Desirable range <100 mg/dL for primary prevention;   <70 mg/dL for patients with CHD or diabetic patients  with > or = 2 CHD risk factors. Marland Kitchen LDL-C is now calculated using the Martin-Hopkins  calculation, which is a validated novel method providing  better accuracy than the Friedewald equation in the  estimation of LDL-C.  Horald Pollen et al. Lenox Ahr. 1610;960(45): 2061-2068  (http://education.[facility].com/faq/FAQ164)    HDL  Date Value Ref Range Status  01/20/2021 35 (L) > OR = 40 mg/dL Final   Triglycerides  Date Value Ref Range  Status  01/20/2021 123 <150 mg/dL Final         Passed - Patient is not pregnant

## 2022-03-19 ENCOUNTER — Other Ambulatory Visit: Payer: Self-pay | Admitting: Podiatry

## 2022-03-24 NOTE — Telephone Encounter (Signed)
Please advise 

## 2022-04-07 ENCOUNTER — Other Ambulatory Visit: Payer: Medicare Other

## 2022-04-07 DIAGNOSIS — E7800 Pure hypercholesterolemia, unspecified: Secondary | ICD-10-CM

## 2022-04-07 DIAGNOSIS — E118 Type 2 diabetes mellitus with unspecified complications: Secondary | ICD-10-CM

## 2022-04-08 LAB — CBC WITH DIFFERENTIAL/PLATELET
Absolute Monocytes: 750 cells/uL (ref 200–950)
Basophils Absolute: 68 cells/uL (ref 0–200)
Basophils Relative: 0.9 %
Eosinophils Absolute: 143 cells/uL (ref 15–500)
Eosinophils Relative: 1.9 %
HCT: 49 % (ref 38.5–50.0)
Hemoglobin: 16.1 g/dL (ref 13.2–17.1)
Lymphs Abs: 2235 cells/uL (ref 850–3900)
MCH: 29.6 pg (ref 27.0–33.0)
MCHC: 32.9 g/dL (ref 32.0–36.0)
MCV: 90.1 fL (ref 80.0–100.0)
MPV: 10.7 fL (ref 7.5–12.5)
Monocytes Relative: 10 %
Neutro Abs: 4305 cells/uL (ref 1500–7800)
Neutrophils Relative %: 57.4 %
Platelets: 257 10*3/uL (ref 140–400)
RBC: 5.44 10*6/uL (ref 4.20–5.80)
RDW: 12.1 % (ref 11.0–15.0)
Total Lymphocyte: 29.8 %
WBC: 7.5 10*3/uL (ref 3.8–10.8)

## 2022-04-08 LAB — COMPREHENSIVE METABOLIC PANEL
AG Ratio: 1.6 (calc) (ref 1.0–2.5)
ALT: 31 U/L (ref 9–46)
AST: 18 U/L (ref 10–35)
Albumin: 4.2 g/dL (ref 3.6–5.1)
Alkaline phosphatase (APISO): 79 U/L (ref 35–144)
BUN: 14 mg/dL (ref 7–25)
CO2: 29 mmol/L (ref 20–32)
Calcium: 9.6 mg/dL (ref 8.6–10.3)
Chloride: 99 mmol/L (ref 98–110)
Creat: 1.23 mg/dL (ref 0.70–1.35)
Globulin: 2.7 g/dL (calc) (ref 1.9–3.7)
Glucose, Bld: 299 mg/dL — ABNORMAL HIGH (ref 65–99)
Potassium: 4.1 mmol/L (ref 3.5–5.3)
Sodium: 138 mmol/L (ref 135–146)
Total Bilirubin: 1.3 mg/dL — ABNORMAL HIGH (ref 0.2–1.2)
Total Protein: 6.9 g/dL (ref 6.1–8.1)

## 2022-04-08 LAB — LIPID PANEL
Cholesterol: 130 mg/dL (ref <200)
HDL: 33 mg/dL — ABNORMAL LOW (ref >=40)
LDL Cholesterol (Calc): 73 mg/dL (calc)
Non-HDL Cholesterol (Calc): 97 mg/dL (calc) (ref <130)
Total CHOL/HDL Ratio: 3.9 (calc) (ref <5.0)
Triglycerides: 164 mg/dL — ABNORMAL HIGH (ref <150)

## 2022-04-08 LAB — HEMOGLOBIN A1C
Hgb A1c MFr Bld: 11.1 % of total Hgb — ABNORMAL HIGH (ref <5.7)
Mean Plasma Glucose: 272 mg/dL
eAG (mmol/L): 15.1 mmol/L

## 2022-04-10 ENCOUNTER — Ambulatory Visit (INDEPENDENT_AMBULATORY_CARE_PROVIDER_SITE_OTHER): Payer: Medicare Other | Admitting: Family Medicine

## 2022-04-10 VITALS — BP 140/82 | HR 105 | Temp 97.9°F | Ht 73.0 in | Wt 270.0 lb

## 2022-04-10 DIAGNOSIS — E1165 Type 2 diabetes mellitus with hyperglycemia: Secondary | ICD-10-CM | POA: Diagnosis not present

## 2022-04-10 MED ORDER — DOXYCYCLINE HYCLATE 100 MG PO TABS: 100.0000 mg | ORAL_TABLET | Freq: Two times a day (BID) | ORAL | 0 refills | Status: DC

## 2022-04-10 MED ORDER — EMPAGLIFLOZIN 25 MG PO TABS: 25.0000 mg | ORAL_TABLET | Freq: Every day | ORAL | 5 refills | Status: DC

## 2022-04-10 MED ORDER — SEMAGLUTIDE(0.25 OR 0.5MG/DOS) 2 MG/3ML ~~LOC~~ SOPN: 0.5000 mg | PEN_INJECTOR | SUBCUTANEOUS | 3 refills | Status: DC

## 2022-04-10 NOTE — Progress Notes (Signed)
Subjective:    Patient ID: Melvin Morrison, male    DOB: [birth year], 71 y.o.   MRN: [id number]  HPI Patient was scheduled for complete physical exam today however we reviewed his lab work his hemoglobin A1c has risen substantially to 11.1.  Therefore we spent the entire visit today in discussion regarding his lab work.  Patient admits that he is drinking more sodas and Gatorade.  He is also eating a high carbohydrate diet.  He stopped taking metformin due to severe diarrhea.  Therefore he is on no medication to lower his sugars.  He denies any polyuria polydipsia or blurry vision.  He denies any chest pain or shortness of breath or dyspnea on exertion.  Lab on 04/07/2022  Component Date Value Ref Range Status   Hgb A1c MFr Bld 04/07/2022 11.1 (H)  <5.7 % of total Hgb Final   Comment: For someone without known diabetes, a hemoglobin A1c value of 6.5% or greater indicates that they may have  diabetes and this should be confirmed with a follow-up  test. . For someone with known diabetes, a value <7% indicates  that their diabetes is well controlled and a value  greater than or equal to 7% indicates suboptimal  control. A1c targets should be individualized based on  duration of diabetes, age, comorbid conditions, and  other considerations. . Currently, no consensus exists regarding use of hemoglobin A1c for diagnosis of diabetes for children. .    Mean Plasma Glucose 04/07/2022 272  mg/dL Final   eAG (mmol/L) 61/60/7371 15.1  mmol/L Final   Glucose, Bld 04/07/2022 299 (H)  65 - 99 mg/dL Final   Comment: .            Fasting reference interval . For someone without known diabetes, a glucose value >125 mg/dL indicates that they may have diabetes and this should be confirmed with a follow-up test. .    BUN 04/07/2022 14  7 - 25 mg/dL Final   Creat 03/13/9484 1.23  0.70 - 1.35 mg/dL Final   BUN/Creatinine Ratio 04/07/2022 NOT APPLICABLE  6 - 22 (calc) Final   Sodium 04/07/2022 138   135 - 146 mmol/L Final   Potassium 04/07/2022 4.1  3.5 - 5.3 mmol/L Final   Chloride 04/07/2022 99  98 - 110 mmol/L Final   CO2 04/07/2022 29  20 - 32 mmol/L Final   Calcium 04/07/2022 9.6  8.6 - 10.3 mg/dL Final   Total Protein 46/27/0350 6.9  6.1 - 8.1 g/dL Final   Albumin 09/38/1829 4.2  3.6 - 5.1 g/dL Final   Globulin 93/71/6967 2.7  1.9 - 3.7 g/dL (calc) Final   AG Ratio 04/07/2022 1.6  1.0 - 2.5 (calc) Final   Total Bilirubin 04/07/2022 1.3 (H)  0.2 - 1.2 mg/dL Final   Alkaline phosphatase (APISO) 04/07/2022 79  35 - 144 U/L Final   AST 04/07/2022 18  10 - 35 U/L Final   ALT 04/07/2022 31  9 - 46 U/L Final   WBC 04/07/2022 7.5  3.8 - 10.8 Thousand/uL Final   RBC 04/07/2022 5.44  4.20 - 5.80 Million/uL Final   Hemoglobin 04/07/2022 16.1  13.2 - 17.1 g/dL Final   HCT 89/38/1017 49.0  38.5 - 50.0 % Final   MCV 04/07/2022 90.1  80.0 - 100.0 fL Final   MCH 04/07/2022 29.6  27.0 - 33.0 pg Final   MCHC 04/07/2022 32.9  32.0 - 36.0 g/dL Final   RDW 51/10/5850 12.1  11.0 - 15.0 %  Final   Platelets 04/07/2022 257  140 - 400 Thousand/uL Final   MPV 04/07/2022 10.7  7.5 - 12.5 fL Final   Neutro Abs 04/07/2022 4,305  1,500 - 7,800 cells/uL Final   Lymphs Abs 04/07/2022 2,235  850 - 3,900 cells/uL Final   Absolute Monocytes 04/07/2022 750  200 - 950 cells/uL Final   Eosinophils Absolute 04/07/2022 143  15 - 500 cells/uL Final   Basophils Absolute 04/07/2022 68  0 - 200 cells/uL Final   Neutrophils Relative % 04/07/2022 57.4  % Final   Total Lymphocyte 04/07/2022 29.8  % Final   Monocytes Relative 04/07/2022 10.0  % Final   Eosinophils Relative 04/07/2022 1.9  % Final   Basophils Relative 04/07/2022 0.9  % Final   Cholesterol 04/07/2022 130  <200 mg/dL Final   HDL 97/67/3419 33 (L)  > OR = 40 mg/dL Final   Triglycerides 37/90/2409 164 (H)  <150 mg/dL Final   LDL Cholesterol (Calc) 04/07/2022 73  mg/dL (calc) Final   Comment: Reference range: <100 . Desirable range <100 mg/dL for  primary prevention;   <70 mg/dL for patients with CHD or diabetic patients  with > or = 2 CHD risk factors. Marland Kitchen LDL-C is now calculated using the Martin-Hopkins  calculation, which is a validated novel method providing  better accuracy than the Friedewald equation in the  estimation of LDL-C.  Horald Pollen et al. Lenox Ahr. 7353;299(24): 2061-2068  (http://education.[facility].com/faq/FAQ164)    Total CHOL/HDL Ratio 04/07/2022 3.9  <2.6 (calc) Final   Non-HDL Cholesterol (Calc) 04/07/2022 97  <130 mg/dL (calc) Final   Comment: For patients with diabetes plus 1 major ASCVD risk  factor, treating to a non-HDL-C goal of <100 mg/dL  (LDL-C of <83 mg/dL) is considered a therapeutic  option.      Past Medical History:  Diagnosis Date   Asthma    BPH (benign prostatic hyperplasia)    Colon polyps    DDD (degenerative disc disease), lumbar    L4-5   Diabetes mellitus without complication (HCC)    GERD (gastroesophageal reflux disease)    Sleep apnea    Past Surgical History:  Procedure Laterality Date   VASECTOMY N/A    [employer] 03/31/2020   Current Outpatient Medications on File Prior to Visit  Medication Sig Dispense Refill   albuterol (PROAIR HFA) 108 (90 Base) MCG/ACT inhaler INHALE 2 PUFFS INTO THE LUNGS EVERY 6 HOURS AS NEEDED FOR WHEEZING OR SHORTNESS OF BREATH 8.5 g 11   alfuzosin (UROXATRAL) 10 MG 24 hr tablet Take 10 mg by mouth daily. RX'd by Dr. Annabell Howells     atorvastatin (LIPITOR) 20 MG tablet TAKE 1 TABLET BY MOUTH EVERY DAY 90 tablet 0   clotrimazole-betamethasone (LOTRISONE) cream Apply 1 application topically 2 (two) times daily. 30 g 0   esomeprazole (NEXIUM) 40 MG capsule TAKE 1 CAPSULE BY MOUTH EVERY DAY 90 capsule 3   meloxicam (MOBIC) 15 MG tablet TAKE 1 TABLET (15 MG TOTAL) BY MOUTH DAILY. 30 tablet 1   metFORMIN (GLUCOPHAGE-XR) 500 MG 24 hr tablet TAKE 2 TABLETS( 1000MG ) BY MOUTH EVERY MORNING FOR 2 WEEKS THEN INCREASE TO 4 DAILY 360 tablet 1   montelukast  (SINGULAIR) 10 MG tablet TAKE 1 TABLET BY MOUTH EVERYDAY AT BEDTIME 90 tablet 1   Testosterone 20.25 MG/ACT (1.62%) GEL RX'd by Dr.     Current Facility-Administered Medications on File Prior to Visit  Medication Dose Route Frequency Provider Last Rate Last Admin   betamethasone acetate-betamethasone sodium  phosphate (CELESTONE) injection 3 mg  3 mg Intra-articular Once Evans, Larena Glassman, DPM       No Known Allergies Social History   Socioeconomic History   Marital status: Single    Spouse name: Not on file   Number of children: Not on file   Years of education: Not on file   Highest education level: Not on file  Occupational History   Not on file  Tobacco Use   Smoking status: Never   Smokeless tobacco: Never  Substance and Sexual Activity   Alcohol use: Yes    Comment: rare   Drug use: No   Sexual activity: Yes    Comment: married, Chartered certified accountant.  Other Topics Concern   Not on file  Social History Narrative   Not on file   Social Determinants of Health   Financial Resource Strain: Not on file  Food Insecurity: Not on file  Transportation Needs: Not on file  Physical Activity: Not on file  Stress: Not on file  Social Connections: Not on file  Intimate Partner Violence: Not on file   Family History  Problem Relation Age of Onset   Diabetes Mother    Diabetes Maternal Grandmother        Review of Systems     Objective:   Physical Exam Vitals reviewed.  Constitutional:      General: He is not in acute distress.    Appearance: Normal appearance. He is obese. He is not ill-appearing, toxic-appearing or diaphoretic.  HENT:     Head: Normocephalic and atraumatic.  Neck:     Vascular: No carotid bruit.  Cardiovascular:     Rate and Rhythm: Normal rate and regular rhythm.     Pulses: Normal pulses.     Heart sounds: Normal heart sounds. No murmur heard.    No friction rub. No gallop.  Pulmonary:     Effort: Pulmonary effort is normal. No respiratory  distress.     Breath sounds: Normal breath sounds. No stridor. No wheezing, rhonchi or rales.  Musculoskeletal:     Cervical back: Normal range of motion and neck supple. No tenderness.     Right lower leg: No edema.     Left lower leg: No edema.  Neurological:     General: No focal deficit present.     Mental Status: He is alert and oriented to person, place, and time. Mental status is at baseline.     Cranial Nerves: No cranial nerve deficit.     Sensory: No sensory deficit.     Motor: No weakness.     Coordination: Coordination normal.     Gait: Gait normal.     Deep Tendon Reflexes: Reflexes normal.  Psychiatric:        Mood and Affect: Mood normal.        Behavior: Behavior normal.        Thought Content: Thought content normal.        Judgment: Judgment normal.           Assessment & Plan:  Uncontrolled type 2 diabetes mellitus with hyperglycemia (HCC) Spent more than 30 minutes today with the patient discussing management of his diabetes.  He does not want to take metformin due to severe diarrhea.  Therefore I will start him on Jardiance.  I gave him samples of 10 mg today and in 1 month he will increase to 25 mg a day.  Also started him on Ozempic 0.5 mg subcu weekly.  We will  uptitrate on a monthly basis as tolerated to a maximum of 2 mg.  Also recommended a low carbohydrate diet with less than 45 g of carbs per meal and 30 minutes a day 5 days a week of aerobic exercise.  Recheck blood sugars in 3 months.  Recheck via telephone in 1 month to see how he is doing

## 2022-04-12 ENCOUNTER — Other Ambulatory Visit: Payer: Self-pay | Admitting: Family Medicine

## 2022-04-12 DIAGNOSIS — J45901 Unspecified asthma with (acute) exacerbation: Secondary | ICD-10-CM

## 2022-04-13 ENCOUNTER — Other Ambulatory Visit: Payer: Self-pay | Admitting: Family Medicine

## 2022-04-13 NOTE — Telephone Encounter (Signed)
Courtesy refill. Requested Prescriptions  Pending Prescriptions Disp Refills  . montelukast (SINGULAIR) 10 MG tablet [Pharmacy Med Name: MONTELUKAST SOD 10 MG TABLET] 30 tablet 0    Sig: TAKE 1 TABLET BY MOUTH EVERYDAY AT BEDTIME     Pulmonology:  Leukotriene Inhibitors Failed - 04/12/2022  5:40 PM      Failed - Valid encounter within last 12 months    Recent Outpatient Visits          1 year ago General medical exam   [facility] Donita Brooks, MD   1 year ago Diabetes mellitus type 2 with complications [facility])   [facility] Pickard, Priscille Heidelberg, MD   2 years ago General medical exam   [facility] Donita Brooks, MD   2 years ago Numbness and tingling in right hand   [facility] Pickard, Priscille Heidelberg, MD   3 years ago General medical exam   [facility] Pickard, Priscille Heidelberg, MD

## 2022-04-14 NOTE — Telephone Encounter (Signed)
There are messages on his prescriptions that he needs his annual physical for further refills.  It's on the atorvastatin, Singulair rx.   I called pt to make an appt for his yearly physical however he said he had his physical on 04/10/2022 but was only given a 30 day supply of his medications.  Dr. Tanya Nones usually gives me 90 day supplies with refills for a year.   Wife got on phone also and mentioned that during his physical these medications were discussed that refills were needed.   So not sure why only 30 day supplies were given this time.  Also mentioned during the physical was a need for a refill for the albuterol inhaler.   He hasn't had to use it for a while so needed a new rx.   I don't see anything mentioned in Dr. Caren Macadam note regarding an albuterol inhaler.    Pt's wife is requesting a call back letting her know this issue has been resolved and sent to [facility] with a 1 yr supply like usual.     I let her know they were only given a 30 day supply because there is a message attached to the rx that he needs his yearly physical however he had it on 04/10/2022.  Needs Lotrisone cream, Singulair, atorvastatin, and albuterol inhaler.

## 2022-05-01 ENCOUNTER — Telehealth: Payer: Self-pay

## 2022-05-01 NOTE — Telephone Encounter (Signed)
Pt called in today, stating that he if doing well on the Ozempic 0.5mg .   Do you want to see him at [facility] before you increase it his dose?  Please advice?

## 2022-05-02 MED ORDER — SEMAGLUTIDE (1 MG/DOSE) 4 MG/3ML ~~LOC~~ SOPN: 1.0000 mg | PEN_INJECTOR | SUBCUTANEOUS | 0 refills | Status: DC

## 2022-05-02 NOTE — Telephone Encounter (Signed)
Called pt this afternoon, increase Ozempic to 1 mg sq weekly. Pt aware Rx has been sent to pharmacy

## 2022-05-11 ENCOUNTER — Ambulatory Visit (INDEPENDENT_AMBULATORY_CARE_PROVIDER_SITE_OTHER): Payer: Medicare Other | Admitting: Family Medicine

## 2022-05-11 ENCOUNTER — Other Ambulatory Visit: Payer: Self-pay | Admitting: Family Medicine

## 2022-05-11 VITALS — BP 120/68 | HR 89 | Temp 97.8°F | Ht 73.0 in | Wt 261.0 lb

## 2022-05-11 DIAGNOSIS — J4520 Mild intermittent asthma, uncomplicated: Secondary | ICD-10-CM

## 2022-05-11 DIAGNOSIS — Z0000 Encounter for general adult medical examination without abnormal findings: Secondary | ICD-10-CM | POA: Diagnosis not present

## 2022-05-11 DIAGNOSIS — E7800 Pure hypercholesterolemia, unspecified: Secondary | ICD-10-CM

## 2022-05-11 DIAGNOSIS — J45901 Unspecified asthma with (acute) exacerbation: Secondary | ICD-10-CM

## 2022-05-11 DIAGNOSIS — E1165 Type 2 diabetes mellitus with hyperglycemia: Secondary | ICD-10-CM

## 2022-05-11 LAB — HM DIABETES EYE EXAM

## 2022-05-11 MED ORDER — MONTELUKAST SODIUM 10 MG PO TABS
ORAL_TABLET | ORAL | 3 refills | Status: DC
Start: 2022-05-11 — End: 2024-01-29

## 2022-05-11 MED ORDER — BLOOD GLUCOSE MONITOR KIT: PACK | 0 refills | Status: AC

## 2022-05-11 MED ORDER — ALBUTEROL SULFATE HFA 108 (90 BASE) MCG/ACT IN AERS
INHALATION_SPRAY | RESPIRATORY_TRACT | 11 refills | Status: DC
Start: 2022-05-11 — End: 2023-08-28

## 2022-05-11 MED ORDER — TRAZODONE HCL 50 MG PO TABS: 50.0000 mg | ORAL_TABLET | Freq: Every day | ORAL | 3 refills | Status: DC

## 2022-05-11 NOTE — Telephone Encounter (Signed)
Requested medication (s) are due for refill today: No  Requested medication (s) are on the active medication list: Yes  Last refill:  05/10/22  Future visit scheduled: No  Notes to clinic:  Pharmacy request ICD code.    Requested Prescriptions  Pending Prescriptions Disp Refills   Blood Glucose Monitoring Suppl (ACCU-CHEK GUIDE ME) w/Device KIT [Pharmacy Med Name: ACCU-CHEK GUIDE ME GLUCOSE MTR]  0    Sig: DISPENSE BASED ON PATIENT AND INSURANCE PREFERENCE. USE UP TO FOUR TIMES DAILY AS DIRECTED.     Endocrinology: Diabetes - Testing Supplies Failed - 05/11/2022 10:33 AM      Failed - Valid encounter within last 12 months    Recent Outpatient Visits           1 year ago General medical exam   [place] Susy Frizzle, MD   1 year ago Diabetes mellitus type 2 with complications [facility])   [place] Pickard, Cammie Mcgee, MD   2 years ago General medical exam   [place] Susy Frizzle, MD   2 years ago Numbness and tingling in right hand   [place], Cammie Mcgee, MD   3 years ago General medical exam   [place] Pickard, Cammie Mcgee, MD

## 2022-05-11 NOTE — Progress Notes (Signed)
Subjective:    Patient ID: Melvin Morrison, male    DOB: 06/18/1951, 71 y.o.   MRN: [id number]  HPI Patient is here today for complete physical exam.  I saw the patient 7/24 and diagnosed him with DM2 (HgA1c was > 11).  Plan at that time: Spent more than 30 minutes today with the patient discussing management of his diabetes.  He does not want to take metformin due to severe diarrhea.  Therefore I will start him on Jardiance.  I gave him samples of 10 mg today and in 1 month he will increase to 25 mg a day.  Also started him on Ozempic 0.5 mg subcu weekly.  We will uptitrate on a monthly basis as tolerated to a maximum of 2 mg.  Also recommended a low carbohydrate diet with less than 45 g of carbs per meal and 30 minutes a day 5 days a week of aerobic exercise.  Recheck blood sugars in 3 months.  Recheck via telephone in 1 month to see how he is doing.  05/11/22 Patient is here today for complete physical exam.  He had a colonoscopy in 2021.  That is up-to-date.  He sees a urologist who performs his PSA and prostate cancer screening.  His immunizations are up-to-date except for the COVID booster, flu shot, and RSV vaccine.  We discussed these today.  He has not yet started checking his blood sugar.  He denies any polyuria polydipsia or blurry vision.  He denies any nausea or vomiting.  However he recently just started the Ozempic.  He has an eye doctor.  He is due for a diabetic eye exam.  Diabetic foot exam was performed today and is normal.  He does report insomnia Immunization History  Administered Date(s) Administered   Fluad Quad(high Dose 65+) 05/20/2019, 06/09/2020, 06/08/2021   Influenza,inj,Quad PF,6+ Mos 06/14/2017, 06/11/2018   Influenza-Unspecified 06/29/2015, 07/06/2016   PFIZER(Purple Top)SARS-COV-2 Vaccination 10/31/2019, 11/23/2019   Pneumococcal Conjugate-13 03/27/2019   Pneumococcal Polysaccharide-23 12/25/2013, 02/26/2018   Tdap 02/26/2018   Zoster Recombinat (Shingrix)  03/26/2018, 05/28/2018   Lab on 04/07/2022  Component Date Value Ref Range Status   Hgb A1c MFr Bld 04/07/2022 11.1 (H)  <5.7 % of total Hgb Final   Comment: For someone without known diabetes, a hemoglobin A1c value of 6.5% or greater indicates that they may have  diabetes and this should be confirmed with a follow-up  test. . For someone with known diabetes, a value <7% indicates  that their diabetes is well controlled and a value  greater than or equal to 7% indicates suboptimal  control. A1c targets should be individualized based on  duration of diabetes, age, comorbid conditions, and  other considerations. . Currently, no consensus exists regarding use of hemoglobin A1c for diagnosis of diabetes for children. .    Mean Plasma Glucose 04/07/2022 272  mg/dL Final   eAG (mmol/L) 41/63/8453 15.1  mmol/L Final   Glucose, Bld 04/07/2022 299 (H)  65 - 99 mg/dL Final   Comment: .            Fasting reference interval . For someone without known diabetes, a glucose value >125 mg/dL indicates that they may have diabetes and this should be confirmed with a follow-up test. .    BUN 04/07/2022 14  7 - 25 mg/dL Final   Creat 64/68/0321 1.23  0.70 - 1.35 mg/dL Final   BUN/Creatinine Ratio 04/07/2022 NOT APPLICABLE  6 - 22 (calc) Final   Sodium  04/07/2022 138  135 - 146 mmol/L Final   Potassium 04/07/2022 4.1  3.5 - 5.3 mmol/L Final   Chloride 04/07/2022 99  98 - 110 mmol/L Final   CO2 04/07/2022 29  20 - 32 mmol/L Final   Calcium 04/07/2022 9.6  8.6 - 10.3 mg/dL Final   Total Protein 17/61/6073 6.9  6.1 - 8.1 g/dL Final   Albumin 71/02/2693 4.2  3.6 - 5.1 g/dL Final   Globulin 85/46/2703 2.7  1.9 - 3.7 g/dL (calc) Final   AG Ratio 04/07/2022 1.6  1.0 - 2.5 (calc) Final   Total Bilirubin 04/07/2022 1.3 (H)  0.2 - 1.2 mg/dL Final   Alkaline phosphatase (APISO) 04/07/2022 79  35 - 144 U/L Final   AST 04/07/2022 18  10 - 35 U/L Final   ALT 04/07/2022 31  9 - 46 U/L Final   WBC  04/07/2022 7.5  3.8 - 10.8 Thousand/uL Final   RBC 04/07/2022 5.44  4.20 - 5.80 Million/uL Final   Hemoglobin 04/07/2022 16.1  13.2 - 17.1 g/dL Final   HCT 50/05/3817 49.0  38.5 - 50.0 % Final   MCV 04/07/2022 90.1  80.0 - 100.0 fL Final   MCH 04/07/2022 29.6  27.0 - 33.0 pg Final   MCHC 04/07/2022 32.9  32.0 - 36.0 g/dL Final   RDW 29/93/7169 12.1  11.0 - 15.0 % Final   Platelets 04/07/2022 257  140 - 400 Thousand/uL Final   MPV 04/07/2022 10.7  7.5 - 12.5 fL Final   Neutro Abs 04/07/2022 4,305  1,500 - 7,800 cells/uL Final   Lymphs Abs 04/07/2022 2,235  850 - 3,900 cells/uL Final   Absolute Monocytes 04/07/2022 750  200 - 950 cells/uL Final   Eosinophils Absolute 04/07/2022 143  15 - 500 cells/uL Final   Basophils Absolute 04/07/2022 68  0 - 200 cells/uL Final   Neutrophils Relative % 04/07/2022 57.4  % Final   Total Lymphocyte 04/07/2022 29.8  % Final   Monocytes Relative 04/07/2022 10.0  % Final   Eosinophils Relative 04/07/2022 1.9  % Final   Basophils Relative 04/07/2022 0.9  % Final   Cholesterol 04/07/2022 130  <200 mg/dL Final   HDL 67/89/3810 33 (L)  > OR = 40 mg/dL Final   Triglycerides 17/51/0258 164 (H)  <150 mg/dL Final   LDL Cholesterol (Calc) 04/07/2022 73  mg/dL (calc) Final   Comment: Reference range: <100 . Desirable range <100 mg/dL for primary prevention;   <70 mg/dL for patients with CHD or diabetic patients  with > or = 2 CHD risk factors. Marland Kitchen LDL-C is now calculated using the Martin-Hopkins  calculation, which is a validated novel method providing  better accuracy than the Friedewald equation in the  estimation of LDL-C.  Horald Pollen et al. Lenox Ahr. 5277;824(23): 2061-2068  (http://education.[facility].com/faq/FAQ164)    Total CHOL/HDL Ratio 04/07/2022 3.9  <5.3 (calc) Final   Non-HDL Cholesterol (Calc) 04/07/2022 97  <130 mg/dL (calc) Final   Comment: For patients with diabetes plus 1 major ASCVD risk  factor, treating to a non-HDL-C goal of <100 mg/dL   (LDL-C of <61 mg/dL) is considered a therapeutic  option.      Past Medical History:  Diagnosis Date   Asthma    BPH (benign prostatic hyperplasia)    Colon polyps    DDD (degenerative disc disease), lumbar    L4-5   Diabetes mellitus without complication (HCC)    GERD (gastroesophageal reflux disease)    Sleep apnea    Past  Surgical History:  Procedure Laterality Date   VASECTOMY N/A    [employer] 03/31/2020   Current Outpatient Medications on File Prior to Visit  Medication Sig Dispense Refill   albuterol (PROAIR HFA) 108 (90 Base) MCG/ACT inhaler INHALE 2 PUFFS INTO THE LUNGS EVERY 6 HOURS AS NEEDED FOR WHEEZING OR SHORTNESS OF BREATH 8.5 g 11   alfuzosin (UROXATRAL) 10 MG 24 hr tablet Take 10 mg by mouth daily. RX'd by Dr. Annabell Howells     atorvastatin (LIPITOR) 20 MG tablet TAKE 1 TABLET BY MOUTH EVERY DAY 90 tablet 0   clotrimazole-betamethasone (LOTRISONE) cream APPLY TO AFFECTED AREA TWICE A DAY 30 g 0   empagliflozin (JARDIANCE) 25 MG TABS tablet Take 1 tablet (25 mg total) by mouth daily before breakfast. 30 tablet 5   esomeprazole (NEXIUM) 40 MG capsule TAKE 1 CAPSULE BY MOUTH EVERY DAY 90 capsule 3   metFORMIN (GLUCOPHAGE-XR) 500 MG 24 hr tablet TAKE 2 TABLETS( 1000MG ) BY MOUTH EVERY MORNING FOR 2 WEEKS THEN INCREASE TO 4 DAILY 360 tablet 1   montelukast (SINGULAIR) 10 MG tablet TAKE 1 TABLET BY MOUTH EVERYDAY AT BEDTIME 30 tablet 0   Semaglutide, 1 MG/DOSE, 4 MG/3ML SOPN Inject 1 mg as directed once a week. 3 mL 0   doxycycline (VIBRA-TABS) 100 MG tablet Take 1 tablet (100 mg total) by mouth 2 (two) times daily. (Patient not taking: Reported on 05/11/2022) 20 tablet 0   meloxicam (MOBIC) 15 MG tablet TAKE 1 TABLET (15 MG TOTAL) BY MOUTH DAILY. (Patient not taking: Reported on 05/11/2022) 30 tablet 1   Testosterone 20.25 MG/ACT (1.62%) GEL RX'd by Dr. 05/13/2022 (Patient not taking: Reported on 05/11/2022)     Current Facility-Administered Medications on File Prior to Visit   Medication Dose Route Frequency Provider Last Rate Last Admin   betamethasone acetate-betamethasone sodium phosphate (CELESTONE) injection 3 mg  3 mg Intra-articular Once Evans, 05/13/2022, DPM       No Known Allergies Social History   Socioeconomic History   Marital status: Single    Spouse name: Not on file   Number of children: Not on file   Years of education: Not on file   Highest education level: Not on file  Occupational History   Not on file  Tobacco Use   Smoking status: Never   Smokeless tobacco: Never  Substance and Sexual Activity   Alcohol use: Yes    Comment: rare   Drug use: No   Sexual activity: Yes    Comment: married, Larena Glassman.  Other Topics Concern   Not on file  Social History Narrative   Not on file   Social Determinants of Health   Financial Resource Strain: Not on file  Food Insecurity: Not on file  Transportation Needs: Not on file  Physical Activity: Not on file  Stress: Not on file  Social Connections: Not on file  Intimate Partner Violence: Not on file   Family History  Problem Relation Age of Onset   Diabetes Mother    Diabetes Maternal Grandmother        Review of Systems     Objective:   Physical Exam Vitals reviewed.  Constitutional:      General: He is not in acute distress.    Appearance: Normal appearance. He is obese. He is not ill-appearing, toxic-appearing or diaphoretic.  HENT:     Head: Normocephalic and atraumatic.     Right Ear: Tympanic membrane, ear canal and external ear normal. There is no impacted  cerumen.     Left Ear: Tympanic membrane, ear canal and external ear normal. There is no impacted cerumen.     Nose: Nose normal.     Mouth/Throat:     Mouth: Mucous membranes are moist.     Pharynx: Oropharynx is clear. No oropharyngeal exudate or posterior oropharyngeal erythema.  Eyes:     General: No scleral icterus.       Right eye: No discharge.        Left eye: No discharge.     Extraocular Movements:  Extraocular movements intact.     Conjunctiva/sclera: Conjunctivae normal.     Pupils: Pupils are equal, round, and reactive to light.  Neck:     Vascular: No carotid bruit.  Cardiovascular:     Rate and Rhythm: Normal rate and regular rhythm.     Pulses: Normal pulses.     Heart sounds: Normal heart sounds. No murmur heard.    No friction rub. No gallop.  Pulmonary:     Effort: Pulmonary effort is normal. No respiratory distress.     Breath sounds: Normal breath sounds. No stridor. No wheezing, rhonchi or rales.  Abdominal:     General: Abdomen is flat. Bowel sounds are normal. There is no distension.     Palpations: Abdomen is soft. There is no mass.     Tenderness: There is no abdominal tenderness. There is no guarding or rebound.     Hernia: No hernia is present.  Musculoskeletal:     Cervical back: Normal range of motion and neck supple. No tenderness.     Right lower leg: No edema.     Left lower leg: No edema.  Skin:    Findings: Erythema and rash present.  Neurological:     General: No focal deficit present.     Mental Status: He is alert and oriented to person, place, and time. Mental status is at baseline.     Cranial Nerves: No cranial nerve deficit.     Sensory: No sensory deficit.     Motor: No weakness.     Coordination: Coordination normal.     Gait: Gait normal.     Deep Tendon Reflexes: Reflexes normal.  Psychiatric:        Mood and Affect: Mood normal.        Behavior: Behavior normal.        Thought Content: Thought content normal.        Judgment: Judgment normal.           Assessment & Plan:  General medical exam  Reactive airway disease, mild intermittent, uncomplicated - Plan: albuterol (PROAIR HFA) 108 (90 Base) MCG/ACT inhaler  Exacerbation of asthma, unspecified asthma severity, unspecified whether persistent - Plan: montelukast (SINGULAIR) 10 MG tablet  Pure hypercholesterolemia  Uncontrolled type 2 diabetes mellitus with hyperglycemia  (HCC) I refilled his albuterol and Singulair.  Recommended that he check his blood sugars every day.  Goal fasting blood sugars less than 130.  Goal 2-hour postprandial sugars are less than 180.  Colonoscopy and prostate cancer screening is up-to-date.  We will try trazodone 50 mg p.o. nightly as needed insomnia.  Recommended a flu shot, and a COVID booster.  Recheck blood sugars in 1 week.  The remainder of his lab work is normal.

## 2022-05-17 ENCOUNTER — Other Ambulatory Visit: Payer: Self-pay | Admitting: Podiatry

## 2022-05-19 NOTE — Telephone Encounter (Signed)
Rx script for generic glucose brand sent to pharmacy 05/19/22 per front office.-Jen

## 2022-06-02 ENCOUNTER — Other Ambulatory Visit: Payer: Self-pay | Admitting: Family Medicine

## 2022-06-05 NOTE — Telephone Encounter (Signed)
Patient last OV was 05/11/22, within a year. Will refill medication. Requested Prescriptions  Pending Prescriptions Disp Refills  . OZEMPIC, 1 MG/DOSE, 4 MG/3ML SOPN [Pharmacy Med Name: OZEMPIC 4 MG/3 ML (1 MG/DOSE)] 3 mL 0    Sig: INJECT 1 MG ONCE A WEEK AS DIRECTED     Endocrinology:  Diabetes - GLP-1 Receptor Agonists - semaglutide Failed - 06/02/2022  4:29 PM      Failed - HBA1C in normal range and within 180 days    Hgb A1c MFr Bld  Date Value Ref Range Status  04/07/2022 11.1 (H) <5.7 % of total Hgb Final    Comment:    For someone without known diabetes, a hemoglobin A1c value of 6.5% or greater indicates that they may have  diabetes and this should be confirmed with a follow-up  test. . For someone with known diabetes, a value <7% indicates  that their diabetes is well controlled and a value  greater than or equal to 7% indicates suboptimal  control. A1c targets should be individualized based on  duration of diabetes, age, comorbid conditions, and  other considerations. . Currently, no consensus exists regarding use of hemoglobin A1c for diagnosis of diabetes for children. .          Failed - Valid encounter within last 6 months    Recent Outpatient Visits          1 year ago General medical exam   [place] Susy Frizzle, MD   1 year ago Diabetes mellitus type 2 with complications [facility])   [place] Pickard, Cammie Mcgee, MD   2 years ago General medical exam   [place] Susy Frizzle, MD   2 years ago Numbness and tingling in right hand   [place], Cammie Mcgee, MD   3 years ago General medical exam   [place] Susy Frizzle, [place]             Passed - Cr in normal range and within 360 days    Creat  Date Value Ref Range Status  04/07/2022 1.23 0.70 - 1.35 mg/dL Final

## 2022-06-07 ENCOUNTER — Other Ambulatory Visit: Payer: Self-pay | Admitting: Podiatry

## 2022-06-08 ENCOUNTER — Telehealth: Payer: Self-pay

## 2022-06-08 NOTE — Telephone Encounter (Signed)
Pt called stating [facility] told him that [facility]em[facility] is on back order and it will be several weeks before they will get it in. Pt asks is there another medication he can use until the Ozempic comes in?

## 2022-06-09 ENCOUNTER — Other Ambulatory Visit: Payer: Self-pay

## 2022-06-09 DIAGNOSIS — R7303 Prediabetes: Secondary | ICD-10-CM

## 2022-06-09 DIAGNOSIS — E1165 Type 2 diabetes mellitus with hyperglycemia: Secondary | ICD-10-CM

## 2022-06-09 MED ORDER — TRULICITY 1.5 MG/0.5ML ~~LOC~~ SOAJ: 1.5000 mg | SUBCUTANEOUS | 3 refills | Status: DC

## 2022-07-03 ENCOUNTER — Ambulatory Visit (INDEPENDENT_AMBULATORY_CARE_PROVIDER_SITE_OTHER): Payer: Medicare Other

## 2022-07-03 DIAGNOSIS — Z23 Encounter for immunization: Secondary | ICD-10-CM | POA: Diagnosis not present

## 2022-07-03 DIAGNOSIS — R7303 Prediabetes: Secondary | ICD-10-CM

## 2022-07-03 DIAGNOSIS — E1165 Type 2 diabetes mellitus with hyperglycemia: Secondary | ICD-10-CM

## 2022-07-07 ENCOUNTER — Other Ambulatory Visit: Payer: Self-pay | Admitting: Family Medicine

## 2022-07-07 NOTE — Telephone Encounter (Signed)
Requested Prescriptions  Pending Prescriptions Disp Refills  . atorvastatin (LIPITOR) 20 MG tablet [Pharmacy Med Name: ATORVASTATIN 20 MG TABLET] 90 tablet 2    Sig: TAKE 1 TABLET BY MOUTH EVERY DAY     Cardiovascular:  Antilipid - Statins Failed - 07/07/2022  1:29 AM      Failed - Valid encounter within last 12 months    Recent Outpatient Visits          1 year ago General medical exam   [place] Susy Frizzle, MD   1 year ago Diabetes mellitus type 2 with complications [facility])   [place] Pickard, Cammie Mcgee, MD   2 years ago General medical exam   [place] Susy Frizzle, MD   2 years ago Numbness and tingling in right hand   [place] Pickard, Cammie Mcgee, MD   3 years ago General medical exam   [place] Susy Frizzle, MD             Failed - Lipid Panel in normal range within the last 12 months    Cholesterol  Date Value Ref Range Status  04/07/2022 130 <200 mg/dL Final   LDL Cholesterol (Calc)  Date Value Ref Range Status  04/07/2022 73 mg/dL (calc) Final    Comment:    Reference range: <100 . Desirable range <100 mg/dL for primary prevention;   <70 mg/dL for patients with CHD or diabetic patients  with > or = 2 CHD risk factors. Marland Kitchen LDL-C is now calculated using the Martin-Hopkins  calculation, which is a validated novel method providing  better accuracy than the Friedewald equation in the  estimation of LDL-C.  Cresenciano Genre et al. Annamaria Helling. 6979;480(16): 2061-2068  (http://education.[facility].com/faq/FAQ164)    HDL  Date Value Ref Range Status  04/07/2022 33 (L) > OR = 40 mg/dL Final   Triglycerides  Date Value Ref Range Status  04/07/2022 164 (H) <150 mg/dL Final         Passed - Patient is not pregnant

## 2022-07-17 ENCOUNTER — Other Ambulatory Visit: Payer: Self-pay | Admitting: Podiatry

## 2022-07-21 ENCOUNTER — Other Ambulatory Visit: Payer: Self-pay | Admitting: Family Medicine

## 2022-07-21 NOTE — Telephone Encounter (Signed)
Medication no longer current  Requested Prescriptions  Pending Prescriptions Disp Refills   OZEMPIC, 1 MG/DOSE, 4 MG/3ML SOPN [Pharmacy Med Name: OZEMPIC 4 MG/3 ML (1 MG/DOSE)]      Sig: INJECT 1 MG ONCE A WEEK AS DIRECTED     Endocrinology:  Diabetes - GLP-1 Receptor Agonists - semaglutide Failed - 07/21/2022 12:22 AM      Failed - HBA1C in normal range and within 180 days    Hgb A1c MFr Bld  Date Value Ref Range Status  04/07/2022 11.1 (H) <5.7 % of total Hgb Final    Comment:    For someone without known diabetes, a hemoglobin A1c value of 6.5% or greater indicates that they may have  diabetes and this should be confirmed with a follow-up  test. . For someone with known diabetes, a value <7% indicates  that their diabetes is well controlled and a value  greater than or equal to 7% indicates suboptimal  control. A1c targets should be individualized based on  duration of diabetes, age, comorbid conditions, and  other considerations. . Currently, no consensus exists regarding use of hemoglobin A1c for diagnosis of diabetes for children. .          Failed - Valid encounter within last 6 months    Recent Outpatient Visits           1 year ago General medical exam   [place] Susy Frizzle, MD   1 year ago Diabetes mellitus type 2 with complications [facility])   [place] Pickard, Cammie Mcgee, MD   2 years ago General medical exam   [place] Susy Frizzle, MD   2 years ago Numbness and tingling in right hand   [place], Cammie Mcgee, MD   3 years ago General medical exam   [place] Susy Frizzle, MD              Passed - Cr in normal range and within 360 days    Creat  Date Value Ref Range Status  04/07/2022 1.23 0.70 - 1.35 mg/dL Final

## 2022-07-23 ENCOUNTER — Other Ambulatory Visit: Payer: Self-pay | Admitting: Family Medicine

## 2022-07-24 ENCOUNTER — Other Ambulatory Visit: Payer: Self-pay

## 2022-07-24 NOTE — Telephone Encounter (Signed)
Unable to refill per protocol, Rx expired. Medication was discontinued 06/09/22 due to dose change. Will refuse.  Requested Prescriptions  Pending Prescriptions Disp Refills   OZEMPIC, 1 MG/DOSE, 4 MG/3ML SOPN [Pharmacy Med Name: OZEMPIC 4 MG/3 ML (1 MG/DOSE)]      Sig: INJECT 1 MG ONCE A WEEK AS DIRECTED     Endocrinology:  Diabetes - GLP-1 Receptor Agonists - semaglutide Failed - 07/23/2022  2:26 PM      Failed - HBA1C in normal range and within 180 days    Hgb A1c MFr Bld  Date Value Ref Range Status  04/07/2022 11.1 (H) <5.7 % of total Hgb Final    Comment:    For someone without known diabetes, a hemoglobin A1c value of 6.5% or greater indicates that they may have  diabetes and this should be confirmed with a follow-up  test. . For someone with known diabetes, a value <7% indicates  that their diabetes is well controlled and a value  greater than or equal to 7% indicates suboptimal  control. A1c targets should be individualized based on  duration of diabetes, age, comorbid conditions, and  other considerations. . Currently, no consensus exists regarding use of hemoglobin A1c for diagnosis of diabetes for children. .          Failed - Valid encounter within last 6 months    Recent Outpatient Visits           1 year ago General medical exam   [place] Susy Frizzle, MD   1 year ago Diabetes mellitus type 2 with complications [facility])   [place] Pickard, Cammie Mcgee, MD   2 years ago General medical exam   [place] Susy Frizzle, MD   2 years ago Numbness and tingling in right hand   [place], Cammie Mcgee, MD   3 years ago General medical exam   [place] Susy Frizzle, MD              Passed - Cr in normal range and within 360 days    Creat  Date Value Ref Range Status  04/07/2022 1.23 0.70 - 1.35 mg/dL Final

## 2022-08-22 ENCOUNTER — Other Ambulatory Visit: Payer: Self-pay | Admitting: Family Medicine

## 2022-08-23 NOTE — Telephone Encounter (Signed)
Requested medication (s) are due for refill today: Yes  Requested medication (s) are on the active medication list: Yes  Last refill:  08/04/21  Future visit scheduled: No  Notes to clinic:  Prescription has expired.    Requested Prescriptions  Pending Prescriptions Disp Refills   esomeprazole (NEXIUM) 40 MG capsule [Pharmacy Med Name: ESOMEPRAZOLE MAG DR 40 MG CAP] 90 capsule 3    Sig: TAKE 1 CAPSULE BY MOUTH EVERY DAY     Gastroenterology: Proton Pump Inhibitors 2 Failed - 08/22/2022  6:44 PM      Failed - Valid encounter within last 12 months    Recent Outpatient Visits           1 year ago General medical exam   [facility] Donita Brooks, MD   1 year ago Diabetes mellitus type 2 with complications [facility])   [facility] Pickard, Priscille Heidelberg, MD   2 years ago General medical exam   [facility] Donita Brooks, MD   2 years ago Numbness and tingling in right hand   [facility] Pickard, Priscille Heidelberg, MD   3 years ago General medical exam   [facility] Donita Brooks, MD              Passed - ALT in normal range and within 360 days    ALT  Date Value Ref Range Status  04/07/2022 31 9 - 46 U/L Final         Passed - AST in normal range and within 360 days    AST  Date Value Ref Range Status  04/07/2022 18 10 - 35 U/L Final

## 2022-10-08 ENCOUNTER — Other Ambulatory Visit: Payer: Self-pay | Admitting: Family Medicine

## 2022-10-13 ENCOUNTER — Other Ambulatory Visit: Payer: Self-pay | Admitting: Family Medicine

## 2022-10-13 DIAGNOSIS — E1165 Type 2 diabetes mellitus with hyperglycemia: Secondary | ICD-10-CM

## 2023-03-01 ENCOUNTER — Other Ambulatory Visit: Payer: Self-pay | Admitting: Family Medicine

## 2023-03-01 DIAGNOSIS — E1165 Type 2 diabetes mellitus with hyperglycemia: Secondary | ICD-10-CM

## 2023-03-01 NOTE — Telephone Encounter (Signed)
Requested medication (s) are due for refill today: yes  Requested medication (s) are on the active medication list: yes  Last refill:  10/13/22  Future visit scheduled: yes in 2 months  Notes to clinic:  was due for recheck of Hgb A1C in 10/23 was not done   Requested Prescriptions  Pending Prescriptions Disp Refills   TRULICITY 1.5 MG/0.5ML SOPN [Pharmacy Med Name: TRULICITY 1.5 MG/0.5 ML PEN]  3    Sig: INJECT 1.5 MG INTO THE SKIN ONCE A WEEK.     Endocrinology:  Diabetes - GLP-1 Receptor Agonists Failed - 03/01/2023  9:34 AM      Failed - HBA1C is between 0 and 7.9 and within 180 days    Hgb A1c MFr Bld  Date Value Ref Range Status  04/07/2022 11.1 (H) <5.7 % of total Hgb Final    Comment:    For someone without known diabetes, a hemoglobin A1c value of 6.5% or greater indicates that they may have  diabetes and this should be confirmed with a follow-up  test. . For someone with known diabetes, a value <7% indicates  that their diabetes is well controlled and a value  greater than or equal to 7% indicates suboptimal  control. A1c targets should be individualized based on  duration of diabetes, age, comorbid conditions, and  other considerations. . Currently, no consensus exists regarding use of hemoglobin A1c for diagnosis of diabetes for children. .          Failed - Valid encounter within last 6 months    Recent Outpatient Visits           1 year ago General medical exam   [facility] Donita Brooks, MD   2 years ago Diabetes mellitus type 2 with complications [facility])   [facility] Pickard, Priscille Heidelberg, MD   2 years ago General medical exam   [facility] Donita Brooks, MD   3 years ago Numbness and tingling in right hand   [facility] Pickard, Priscille Heidelberg, MD   3 years ago General medical exam   [facility] Donita Brooks, MD       Future Appointments             In 2  months Pickard, Priscille Heidelberg, MD [facility], [facility]

## 2023-03-12 ENCOUNTER — Other Ambulatory Visit: Payer: Self-pay | Admitting: Family Medicine

## 2023-04-02 ENCOUNTER — Other Ambulatory Visit: Payer: Self-pay | Admitting: Family Medicine

## 2023-05-04 ENCOUNTER — Other Ambulatory Visit (INDEPENDENT_AMBULATORY_CARE_PROVIDER_SITE_OTHER): Payer: Self-pay | Admitting: Family Medicine

## 2023-05-07 ENCOUNTER — Other Ambulatory Visit: Payer: Self-pay | Admitting: Family Medicine

## 2023-05-07 ENCOUNTER — Telehealth: Payer: Self-pay | Admitting: Family Medicine

## 2023-05-07 ENCOUNTER — Other Ambulatory Visit: Payer: Medicare Other

## 2023-05-07 MED ORDER — NIRMATRELVIR/RITONAVIR (PAXLOVID)TABLET: 3.0000 | ORAL_TABLET | Freq: Two times a day (BID) | ORAL | 0 refills | Status: AC

## 2023-05-07 NOTE — Telephone Encounter (Signed)
Patient called to report a positive COVID test result taken last night.   Sx: cough, runny nose, some diarrhea, occasional headaches and body aches  Ph[facility] confirmed as  [id number] [place] [place] - [address] AT [address] [facility] [address] [place], [place] [address] Phone: [phone]  Fax: [phone] DEA #: [id number]   Please advise at [phone].

## 2023-05-11 ENCOUNTER — Other Ambulatory Visit: Payer: Medicare Other

## 2023-05-11 DIAGNOSIS — E1165 Type 2 diabetes mellitus with hyperglycemia: Secondary | ICD-10-CM

## 2023-05-11 DIAGNOSIS — E7800 Pure hypercholesterolemia, unspecified: Secondary | ICD-10-CM

## 2023-05-11 DIAGNOSIS — N400 Enlarged prostate without lower urinary tract symptoms: Secondary | ICD-10-CM

## 2023-05-12 LAB — COMPLETE METABOLIC PANEL WITH GFR
AG Ratio: 1.6 (calc) (ref 1.0–2.5)
ALT: 23 U/L (ref 9–46)
AST: 17 U/L (ref 10–35)
Albumin: 4.1 g/dL (ref 3.6–5.1)
Alkaline phosphatase (APISO): 82 U/L (ref 35–144)
BUN: 12 mg/dL (ref 7–25)
CO2: 26 mmol/L (ref 20–32)
Calcium: 9.5 mg/dL (ref 8.6–10.3)
Chloride: 102 mmol/L (ref 98–110)
Creat: 1.17 mg/dL (ref 0.70–1.28)
Globulin: 2.5 g/dL (ref 1.9–3.7)
Glucose, Bld: 117 mg/dL — ABNORMAL HIGH (ref 65–99)
Potassium: 4.1 mmol/L (ref 3.5–5.3)
Sodium: 139 mmol/L (ref 135–146)
Total Bilirubin: 1.4 mg/dL — ABNORMAL HIGH (ref 0.2–1.2)
Total Protein: 6.6 g/dL (ref 6.1–8.1)
eGFR: 67 mL/min/{1.73_m2} (ref >=60)

## 2023-05-12 LAB — CBC WITH DIFFERENTIAL/PLATELET
Absolute Monocytes: 680 {cells}/uL (ref 200–950)
Basophils Absolute: 48 {cells}/uL (ref 0–200)
Basophils Relative: 0.7 %
Eosinophils Absolute: 211 {cells}/uL (ref 15–500)
Eosinophils Relative: 3.1 %
HCT: 49.8 % (ref 38.5–50.0)
Hemoglobin: 16.3 g/dL (ref 13.2–17.1)
Lymphs Abs: 1829 {cells}/uL (ref 850–3900)
MCH: 29.7 pg (ref 27.0–33.0)
MCHC: 32.7 g/dL (ref 32.0–36.0)
MCV: 90.7 fL (ref 80.0–100.0)
MPV: 9.8 fL (ref 7.5–12.5)
Monocytes Relative: 10 %
Neutro Abs: 4032 {cells}/uL (ref 1500–7800)
Neutrophils Relative %: 59.3 %
Platelets: 282 10*3/uL (ref 140–400)
RBC: 5.49 10*6/uL (ref 4.20–5.80)
RDW: 12.1 % (ref 11.0–15.0)
Total Lymphocyte: 26.9 %
WBC: 6.8 10*3/uL (ref 3.8–10.8)

## 2023-05-12 LAB — LIPID PANEL
Cholesterol: 131 mg/dL (ref <200)
HDL: 35 mg/dL — ABNORMAL LOW (ref >=40)
LDL Cholesterol (Calc): 75 mg/dL
Non-HDL Cholesterol (Calc): 96 mg/dL (ref <130)
Total CHOL/HDL Ratio: 3.7 (calc) (ref <5.0)
Triglycerides: 126 mg/dL (ref <150)

## 2023-05-12 LAB — HEMOGLOBIN A1C
Hgb A1c MFr Bld: 7.6 %{Hb} — ABNORMAL HIGH (ref <5.7)
Mean Plasma Glucose: 171 mg/dL
eAG (mmol/L): 9.5 mmol/L

## 2023-05-12 LAB — PSA: PSA: 0.64 ng/mL (ref <=4.00)

## 2023-05-14 ENCOUNTER — Ambulatory Visit (INDEPENDENT_AMBULATORY_CARE_PROVIDER_SITE_OTHER): Payer: Medicare Other | Admitting: Family Medicine

## 2023-05-14 VITALS — BP 138/88 | HR 103 | Temp 97.9°F | Ht 73.0 in | Wt 263.8 lb

## 2023-05-14 DIAGNOSIS — Z7985 Long-term (current) use of injectable non-insulin antidiabetic drugs: Secondary | ICD-10-CM

## 2023-05-14 DIAGNOSIS — Z0000 Encounter for general adult medical examination without abnormal findings: Secondary | ICD-10-CM

## 2023-05-14 DIAGNOSIS — M25552 Pain in left hip: Secondary | ICD-10-CM | POA: Diagnosis not present

## 2023-05-14 DIAGNOSIS — E7800 Pure hypercholesterolemia, unspecified: Secondary | ICD-10-CM | POA: Diagnosis not present

## 2023-05-14 DIAGNOSIS — Z0001 Encounter for general adult medical examination with abnormal findings: Secondary | ICD-10-CM | POA: Diagnosis not present

## 2023-05-14 DIAGNOSIS — E118 Type 2 diabetes mellitus with unspecified complications: Secondary | ICD-10-CM

## 2023-05-14 MED ORDER — TIRZEPATIDE 7.5 MG/0.5ML ~~LOC~~ SOAJ: 7.5000 mg | SUBCUTANEOUS | 1 refills | Status: DC

## 2023-05-14 NOTE — Progress Notes (Signed)
Subjective:    Patient ID: Melvin Morrison, male    DOB: Jun 04, 1951, 72 y.o.   MRN: [id number]  HPI Patient is here today for complete physical exam.  The last time I saw the patient, his A1c was greater than 11.  He started taking Ozempic and his A1c has dropped to 7.6.  Unfortunately he can no longer get the Ozempic and he had to transition to Trulicity 1.5 mg subcu weekly.  This does not seem to be working well for him.  Also on exam today, his blood pressure is borderline elevated and his heart rate is elevated.  After I had the patient sit on the exam table, his heart rate stayed around 110 bpm for more than 10 minutes.  He does report some shortness of breath with activity but he denies any chest pain.  However he has numerous risk factor including hypertension and controlled diabetes along with the age.  Patient has not had a stress test.  Patient goes to GI at [facility].  They contact him regularly.  He states that his last colonoscopy was less than 3 years ago.  His PSA was recently checked and is listed below.  Patient's immunizations are listed below Immunization History  Administered Date(s) Administered   COVID-19, mRNA, vaccine(Comirnaty)12 years and older 08/01/2022   Fluad Quad(high Dose 65+) 05/20/2019, 06/09/2020, 06/08/2021   Influenza,inj,Quad PF,6+ Mos 06/14/2017, 06/11/2018, 07/03/2022   Influenza-Unspecified 06/26/2012, 06/29/2015, 07/06/2016   PFIZER Comirnaty(Gray Top)Covid-19 Tri-Sucrose Vaccine 12/31/2020   PFIZER(Purple Top)SARS-COV-2 Vaccination 10/31/2019, 11/23/2019   Pfizer Covid-19 Vaccine Bivalent Booster 63yrs & up 06/13/2021, 02/02/2022   Pneumococcal Conjugate-13 03/27/2019   Pneumococcal Polysaccharide-23 12/25/2013, 02/26/2018   Respiratory Syncytial Virus Vaccine,Recomb Aduvanted(Arexvy) 08/15/2022   Tdap 02/26/2018   Zoster Recombinant(Shingrix) 03/26/2018, 05/28/2018   Lab on 05/11/2023  Component Date Value Ref Range Status   WBC 05/11/2023 6.8  3.8 -  10.8 Thousand/uL Final   RBC 05/11/2023 5.49  4.20 - 5.80 Million/uL Final   Hemoglobin 05/11/2023 16.3  13.2 - 17.1 g/dL Final   HCT 78/46/9629 49.8  38.5 - 50.0 % Final   MCV 05/11/2023 90.7  80.0 - 100.0 fL Final   MCH 05/11/2023 29.7  27.0 - 33.0 pg Final   MCHC 05/11/2023 32.7  32.0 - 36.0 g/dL Final   RDW 52/84/1324 12.1  11.0 - 15.0 % Final   Platelets 05/11/2023 282  140 - 400 Thousand/uL Final   MPV 05/11/2023 9.8  7.5 - 12.5 fL Final   Neutro Abs 05/11/2023 4,032  1,500 - 7,800 cells/uL Final   Lymphs Abs 05/11/2023 1,829  850 - 3,900 cells/uL Final   Absolute Monocytes 05/11/2023 680  200 - 950 cells/uL Final   Eosinophils Absolute 05/11/2023 211  15 - 500 cells/uL Final   Basophils Absolute 05/11/2023 48  0 - 200 cells/uL Final   Neutrophils Relative % 05/11/2023 59.3  % Final   Total Lymphocyte 05/11/2023 26.9  % Final   Monocytes Relative 05/11/2023 10.0  % Final   Eosinophils Relative 05/11/2023 3.1  % Final   Basophils Relative 05/11/2023 0.7  % Final   Glucose, Bld 05/11/2023 117 (H)  65 - 99 mg/dL Final   Comment: .            Fasting reference interval . For someone without known diabetes, a glucose value between 100 and 125 mg/dL is consistent with prediabetes and should be confirmed with a follow-up test. .    BUN 05/11/2023 12  7 -  25 mg/dL Final   Creat 04/54/0981 1.17  0.70 - 1.28 mg/dL Final   eGFR 19/14/7829 67  > OR = 60 mL/min/1.101m2 Final   BUN/Creatinine Ratio 05/11/2023 SEE NOTE:  6 - 22 (calc) Final   Comment:    Not Reported: BUN and Creatinine are within    reference range. .    Sodium 05/11/2023 139  135 - 146 mmol/L Final   Potassium 05/11/2023 4.1  3.5 - 5.3 mmol/L Final   Chloride 05/11/2023 102  98 - 110 mmol/L Final   CO2 05/11/2023 26  20 - 32 mmol/L Final   Calcium 05/11/2023 9.5  8.6 - 10.3 mg/dL Final   Total Protein 56/21/3086 6.6  6.1 - 8.1 g/dL Final   Albumin 57/84/6962 4.1  3.6 - 5.1 g/dL Final   Globulin 95/28/4132 2.5   1.9 - 3.7 g/dL (calc) Final   AG Ratio 05/11/2023 1.6  1.0 - 2.5 (calc) Final   Total Bilirubin 05/11/2023 1.4 (H)  0.2 - 1.2 mg/dL Final   Alkaline phosphatase (APISO) 05/11/2023 82  35 - 144 U/L Final   AST 05/11/2023 17  10 - 35 U/L Final   ALT 05/11/2023 23  9 - 46 U/L Final   Hgb A1c MFr Bld 05/11/2023 7.6 (H)  <5.7 % of total Hgb Final   Comment: For someone without known diabetes, a hemoglobin A1c value of 6.5% or greater indicates that they may have  diabetes and this should be confirmed with a follow-up  test. . For someone with known diabetes, a value <7% indicates  that their diabetes is well controlled and a value  greater than or equal to 7% indicates suboptimal  control. A1c targets should be individualized based on  duration of diabetes, age, comorbid conditions, and  other considerations. . Currently, no consensus exists regarding use of hemoglobin A1c for diagnosis of diabetes for children. .    Mean Plasma Glucose 05/11/2023 171  mg/dL Final   eAG (mmol/L) 44/09/270 9.5  mmol/L Final   Comment: . This test was performed on the Roche cobas c503 platform. Effective 06/26/22, a change in test platforms from the Abbott Architect to the Roche cobas c503 may have shifted HbA1c results compared to historical results. Based on laboratory validation testing conducted at [facility], the Roche platform relative to the Abbott platform had an average increase in HbA1c value of < or = 0.3%. This difference is within accepted  variability established by the [facility]. Note that not all individuals will have had a shift in their results and direct comparisons between historical and current results for testing conducted on different platforms is not recommended.    Cholesterol 05/11/2023 131  <200 mg/dL Final   HDL 53/66/4403 35 (L)  > OR = 40 mg/dL Final   Triglycerides 47/42/5956 126  <150 mg/dL Final   LDL Cholesterol (Calc) 05/11/2023  75  mg/dL (calc) Final   Comment: Reference range: <100 . Desirable range <100 mg/dL for primary prevention;   <70 mg/dL for patients with CHD or diabetic patients  with > or = 2 CHD risk factors. Marland Kitchen LDL-C is now calculated using the Martin-Hopkins  calculation, which is a validated novel method providing  better accuracy than the Friedewald equation in the  estimation of LDL-C.  Horald Pollen et al. Lenox Ahr. 3875;643(32): 2061-2068  (http://education.[facility].com/faq/FAQ164)    Total CHOL/HDL Ratio 05/11/2023 3.7  <9.5 (calc) Final   Non-HDL Cholesterol (Calc) 05/11/2023 96  <130 mg/dL (calc) Final  Comment: For patients with diabetes plus 1 major ASCVD risk  factor, treating to a non-HDL-C goal of <100 mg/dL  (LDL-C of <42 mg/dL) is considered a therapeutic  option.    PSA 05/11/2023 0.64  < OR = 4.00 ng/mL Final   Comment: The total PSA value from this assay system is  standardized against the WHO standard. The test  result will be approximately 20% lower when compared  to the equimolar-standardized total PSA (Beckman  Coulter). Comparison of serial PSA results should be  interpreted with this fact in mind. . This test was performed using the Siemens  chemiluminescent method. Values obtained from  different assay methods cannot be used interchangeably. PSA levels, regardless of value, should not be interpreted as absolute evidence of the presence or absence of disease.      Past Medical History:  Diagnosis Date   Asthma    BPH (benign prostatic hyperplasia)    Colon polyps    DDD (degenerative disc disease), lumbar    L4-5   Diabetes mellitus without complication (HCC)    GERD (gastroesophageal reflux disease)    Sleep apnea    Past Surgical History:  Procedure Laterality Date   VASECTOMY N/A    [employer]reesia 03/31/2020   Current Outpatient Medications on File Prior to Visit  Medication Sig Dispense Refill   albuterol (PROAIR HFA) 108 (90 Base) MCG/ACT inhaler  INHALE 2 PUFFS INTO THE LUNGS EVERY 6 HOURS AS NEEDED FOR WHEEZING OR SHORTNESS OF BREATH 8.5 g 11   alfuzosin (UROXATRAL) 10 MG 24 hr tablet Take 10 mg by mouth daily. RX'd by Dr. Annabell Howells     atorvastatin (LIPITOR) 20 MG tablet TAKE 1 TABLET BY MOUTH EVERY DAY 90 tablet 0   blood glucose meter kit and supplies KIT Dispense based on patient and insurance preference. Use up to four times daily as directed. 1 each 0   clotrimazole-betamethasone (LOTRISONE) cream APPLY TO AFFECTED AREA TWICE A DAY 30 g 0   esomeprazole (NEXIUM) 40 MG capsule TAKE 1 CAPSULE BY MOUTH EVERY DAY 90 capsule 3   JARDIANCE 25 MG TABS tablet TAKE 1 TABLET BY MOUTH EVERY DAY BEFORE BREAKFAST 30 tablet 5   meloxicam (MOBIC) 15 MG tablet TAKE 1 TABLET (15 MG TOTAL) BY MOUTH DAILY. 30 tablet 0   metFORMIN (GLUCOPHAGE-XR) 500 MG 24 hr tablet TAKE 2 TABLETS( 1000MG ) BY MOUTH EVERY MORNING FOR 2 WEEKS THEN INCREASE TO 4 DAILY 360 tablet 1   montelukast (SINGULAIR) 10 MG tablet TAKE 1 TABLET BY MOUTH EVERYDAY AT BEDTIME 90 tablet 3   doxycycline (VIBRA-TABS) 100 MG tablet Take 1 tablet (100 mg total) by mouth 2 (two) times daily. (Patient not taking: Reported on 05/11/2022) 20 tablet 0   Testosterone 20.25 MG/ACT (1.62%) GEL RX'd by Dr. Annabell Howells (Patient not taking: Reported on 05/14/2023)     traZODone (DESYREL) 50 MG tablet Take 1 tablet (50 mg total) by mouth at bedtime. (Patient not taking: Reported on 05/14/2023) 90 tablet 3   Current Facility-Administered Medications on File Prior to Visit  Medication Dose Route Frequency Provider Last Rate Last Admin   betamethasone acetate-betamethasone sodium phosphate (CELESTONE) injection 3 mg  3 mg Intra-articular Once Felecia Shelling, DPM       No Known Allergies Social History   Socioeconomic History   Marital status: Single    Spouse name: Not on file   Number of children: Not on file   Years of education: Not on file   Highest education  level: Not on file  Occupational History   Not  on file  Tobacco Use   Smoking status: Never   Smokeless tobacco: Never  Substance and Sexual Activity   Alcohol use: Yes    Comment: rare   Drug use: No   Sexual activity: Yes    Comment: married, Chartered certified accountant.  Other Topics Concern   Not on file  Social History Narrative   Not on file   Social Determinants of Health   Financial Resource Strain: Not on file  Food Insecurity: Not on file  Transportation Needs: Not on file  Physical Activity: Not on file  Stress: Not on file  Social Connections: Not on file  Intimate Partner Violence: Not on file   Family History  Problem Relation Age of Onset   Diabetes Mother    Diabetes Maternal Grandmother        Review of Systems     Objective:   Physical Exam Vitals reviewed.  Constitutional:      General: He is not in acute distress.    Appearance: Normal appearance. He is obese. He is not ill-appearing, toxic-appearing or diaphoretic.  HENT:     Head: Normocephalic and atraumatic.     Right Ear: Tympanic membrane, ear canal and external ear normal. There is no impacted cerumen.     Left Ear: Tympanic membrane, ear canal and external ear normal. There is no impacted cerumen.     Nose: Nose normal.     Mouth/Throat:     Mouth: Mucous membranes are moist.     Pharynx: Oropharynx is clear. No oropharyngeal exudate or posterior oropharyngeal erythema.  Eyes:     General: No scleral icterus.       Right eye: No discharge.        Left eye: No discharge.     Extraocular Movements: Extraocular movements intact.     Conjunctiva/sclera: Conjunctivae normal.     Pupils: Pupils are equal, round, and reactive to light.  Neck:     Vascular: No carotid bruit.  Cardiovascular:     Rate and Rhythm: Regular rhythm. Tachycardia present.     Pulses: Normal pulses.     Heart sounds: Normal heart sounds. No murmur heard.    No friction rub. No gallop.  Pulmonary:     Effort: Pulmonary effort is normal. No respiratory distress.      Breath sounds: Normal breath sounds. No stridor. No wheezing, rhonchi or rales.  Abdominal:     General: Abdomen is flat. Bowel sounds are normal. There is no distension.     Palpations: Abdomen is soft. There is no mass.     Tenderness: There is no abdominal tenderness. There is no guarding or rebound.     Hernia: No hernia is present.  Musculoskeletal:     Cervical back: Normal range of motion and neck supple. No tenderness.     Right lower leg: No edema.     Left lower leg: No edema.  Skin:    Findings: No erythema or rash.  Neurological:     General: No focal deficit present.     Mental Status: He is alert and oriented to person, place, and time. Mental status is at baseline.     Cranial Nerves: No cranial nerve deficit.     Sensory: No sensory deficit.     Motor: No weakness.     Coordination: Coordination normal.     Gait: Gait normal.     Deep Tendon Reflexes: Reflexes  normal.  Psychiatric:        Mood and Affect: Mood normal.        Behavior: Behavior normal.        Thought Content: Thought content normal.        Judgment: Judgment normal.           Assessment & Plan:  Controlled type 2 diabetes mellitus with complication, without long-term current use of insulin (HCC) - Plan: CT CARDIAC SCORING (SELF PAY ONLY)  Left hip pain - Plan: Ambulatory referral to Orthopedic Surgery  Hypercholesterolemia  General medical exam Patient's blood pressure today is borderline.  Furthermore he has profound tachycardia with minimal activity.  He is in normal sinus rhythm however he does report shortness of breath with activity.  Given his age, elevated blood pressure, history of hyperlipidemia, and poorly controlled diabetes I am concerned about underlying coronary artery disease.  I have recommended a coronary artery calcium score to help risk stratify the patient for regular.  He is willing to do this.  We have decided to discontinue Trulicity and replace with Mounjaro 7.5 mg subcu  weekly.  We will uptitrate monthly to 15 mg to try to maximize weight loss and hopefully get his A1c below 7.  Immunizations are up-to-date.  Colonoscopy is up-to-date.  PSA is acceptable.  Patient does report left lateral hip pain.  He also reports posterior hip pain with walking.  He would like a referral to orthopedics.

## 2023-05-22 ENCOUNTER — Other Ambulatory Visit: Payer: Self-pay | Admitting: Family Medicine

## 2023-05-23 ENCOUNTER — Ambulatory Visit (HOSPITAL_COMMUNITY)
Admission: RE | Admit: 2023-05-23 | Discharge: 2023-05-23 | Disposition: A | Discharge status: Home / Self Care | Payer: Medicare Other | Source: Ambulatory Visit | Attending: Family Medicine | Admitting: Family Medicine

## 2023-05-23 DIAGNOSIS — E118 Type 2 diabetes mellitus with unspecified complications: Secondary | ICD-10-CM

## 2023-05-24 NOTE — Telephone Encounter (Signed)
Requested medication (s) are due for refill today:yes  Requested medication (s) are on the active medication list:yes  Last refill:  04/17/22 30 grams  Future visit scheduled: no  Notes to clinic:  med not assigned to a protocol   Requested Prescriptions  Pending Prescriptions Disp Refills   clotrimazole-betamethasone (LOTRISONE) cream [Pharmacy Med Name: CLOTRIMAZOLE-BETAMETHASONE CRM] 30 g 0    Sig: APPLY TO AFFECTED AREA TWICE A DAY     Off-Protocol Failed - 05/22/2023  1:37 PM      Failed - Medication not assigned to a protocol, review manually.      Failed - Valid encounter within last 12 months    Recent Outpatient Visits           2 years ago General medical exam   [facility] Donita Brooks, MD   2 years ago Diabetes mellitus type 2 with complications [facility])   [facility] Pickard, Priscille Heidelberg, MD   3 years ago General medical exam   [facility] Donita Brooks, MD   3 years ago Numbness and tingling in right hand   [facility] Pickard, Priscille Heidelberg, MD   4 years ago General medical exam   [facility] Pickard, Priscille Heidelberg, MD

## 2023-05-28 ENCOUNTER — Other Ambulatory Visit (INDEPENDENT_AMBULATORY_CARE_PROVIDER_SITE_OTHER): Payer: Medicare Other

## 2023-05-28 ENCOUNTER — Encounter: Payer: Self-pay | Admitting: Orthopaedic Surgery

## 2023-05-28 ENCOUNTER — Ambulatory Visit: Payer: Medicare Other | Admitting: Orthopaedic Surgery

## 2023-05-28 VITALS — Ht 73.0 in | Wt 266.0 lb

## 2023-05-28 DIAGNOSIS — M25551 Pain in right hip: Secondary | ICD-10-CM

## 2023-05-28 DIAGNOSIS — M25552 Pain in left hip: Secondary | ICD-10-CM

## 2023-05-28 NOTE — Progress Notes (Signed)
The patient is a 72 year old gentleman I am seeing for the first time.  He was sent to me to evaluate left great and right hip pain.  Is been painful with activities and walking and he denies any groin pain.  It is not a constant pain but is off and on.  He actually points to the left pelvis and ASIS of where the source of his pain is.  He is a diabetic and is working to try to improve his blood glucose.  His last A1c was down to 7.6 just 3 weeks ago.  His BMI is 35.09.  I was able to review his medications and past medical history within epic.  On exam he walks with a normal gait.  Both of his hips move smoothly and fluidly with no pain in the groin and no blocks to rotation.  There is no pain to palpation over the trochanteric area of either side.  On the left side he does have significant pain over his ASIS and oblique muscles.  There is no rash that I can see in this area and the skin is intact.  He has similar pain on the right side but it is not as significant as it is on the left side.  An AP pelvis and lateral both hips shows normal-appearing hips bilaterally.  The hip joint spaces are well-maintained and well-preserved.  This seems to be more related to pain along the oblique muscles and the ASIS of the left side.  I have recommended Voltaren gel topically for this area and I did recommend physical therapy and at least seeing my partner Dr. Shon Baton for his evaluation of this area to see if he would benefit from any type of soft tissue intraoffice sonogram type of procedure.  He would like to see Dr. Shon Baton in follow-up.  We will see about getting that established.

## 2023-05-30 ENCOUNTER — Other Ambulatory Visit: Payer: Self-pay

## 2023-05-30 MED ORDER — ROSUVASTATIN CALCIUM 40 MG PO TABS: 40.0000 mg | ORAL_TABLET | Freq: Every day | ORAL | 1 refills | Status: DC

## 2023-06-04 ENCOUNTER — Ambulatory Visit (INDEPENDENT_AMBULATORY_CARE_PROVIDER_SITE_OTHER): Payer: Medicare Other | Admitting: Sports Medicine

## 2023-06-04 ENCOUNTER — Other Ambulatory Visit: Payer: Self-pay

## 2023-06-04 ENCOUNTER — Encounter: Payer: Self-pay | Admitting: Sports Medicine

## 2023-06-04 DIAGNOSIS — M7622 Iliac crest spur, left hip: Secondary | ICD-10-CM

## 2023-06-04 DIAGNOSIS — M25551 Pain in right hip: Secondary | ICD-10-CM | POA: Diagnosis not present

## 2023-06-04 DIAGNOSIS — M25552 Pain in left hip: Secondary | ICD-10-CM

## 2023-06-04 MED ORDER — MELOXICAM 15 MG PO TABS: 15.0000 mg | ORAL_TABLET | Freq: Every day | ORAL | 0 refills | Status: AC

## 2023-06-04 NOTE — Progress Notes (Signed)
Patient states that at his appointment with Dr. Magnus Ivan his hip xrays were clean. He says he has pain in both hips but the left is worse than the right.   Patient was instructed in 10 minutes of therapeutic exercises for the left and right hips to improve strength, ROM and function according to my instructions and plan of care by a Certified Athletic Trainer during the office visit. A customized handout was provided and demonstration of proper technique shown and discussed. Patient did perform exercises and demonstrate understanding through teachback.  All questions discussed and answered.

## 2023-06-04 NOTE — Progress Notes (Signed)
KIANI STANDING - 72 y.o. male MRN [id number]  Date of birth: Apr 07, 1951  Office Visit Note: Visit Date: 06/04/2023 PCP: Donita Edeline Greening, MD Referred by: Donita Liana Camerer, MD  Subjective: Chief Complaint  Patient presents with   Left Hip - Pain   Right Hip - Pain   HPI: Melvin Morrison "Melvin Morrison" is a pleasant 72 y.o. male who presents today for bilateral hip pain, L > R anterior hip.  He has pain over bilateral hips but the left is worse than the right.  He recently got back from an [place] cruise and was doing a lot of walking which seems to aggravate his pain.  He did see Dr. Magnus Ivan x-rays of the hip which did not show much arthritic change.  He did recommend Voltaren gel, he has tried this and does note that his hip pain has been improving with this.  He has not yet returned back to much walking yet.  He is not doing any formalized physical therapy or taking any pain medication currently.  Pertinent ROS were reviewed with the patient and found to be negative unless otherwise specified above in HPI.   Assessment & Plan: Visit Diagnoses:  1. Pain in left hip   2. Iliac crest spur of left hip   3. Bilateral hip pain    Plan: Discussed with Melvin Morrison the nature of his left greater than right hip pain.  X-rays are reassuring against much arthritic change.  X-ray and ultrasound does show a small iliac crest spur of the left side with surrounding hypoechoic fluid.  This likely was chronic but exacerbated from his recent increase walking from his trip.  He has a very mild Trendelenburg gait to the opposite side.  We talked about treatment options including oral medication, formalized physical therapy versus HEP, injection therapy.  He is making some improvements with topical Voltaren gel, he may continue this a few times daily as needed.  I would like to start him on meloxicam 15 mg once daily to take for the next 2 to 3 weeks daily and then he may discontinue this.  I would like to strengthen  his hip flexors and his lateral abductors, he agreed to home exercises.  My athletic trainer, Isabelle Course I did review these exercises with him in the room today.  He will perform these once daily.  I would like to see how he does in about 1 month, he may follow-up then as needed.  Follow-up: Return in about 1 month (around 07/04/2023), or if symptoms worsen or fail to improve, for L > R hip.   Meds & Orders:  Meds ordered this encounter  Medications   meloxicam (MOBIC) 15 MG tablet    Sig: Take 1 tablet (15 mg total) by mouth daily.    Dispense:  30 tablet    Refill:  0    Orders Placed This Encounter  Procedures   [place] Extrem Low Left Ltd     Procedures: No procedures performed      Clinical History: No specialty comments available.  He reports that he has never smoked. He has never used smokeless tobacco.  Recent Labs    05/11/23 0959  HGBA1C 7.6*    Objective:    Physical Exam  Gen: Well-appearing, in no acute distress; non-toxic CV:  Well-perfused. Warm.  Resp: Breathing unlabored on room air; no wheezing. Psych: Fluid speech in conversation; appropriate affect; normal thought process Neuro: Sensation intact throughout. No gross coordination deficits.  Ortho Exam - Bilateral hips: + Positive TTP over the superior lateral aspect of the left greater than right iliac crest.  There is no TTP over the ASIS or the greater trochanter.  No restriction with internal or external logroll.  He has full range of motion about the hip with good strength.  There is very mild Trendelenburg to the contralateral right hip with gait analysis.  Imaging:  [place] Extrem Low Left Ltd Limited musculoskeletal ultrasound of the left lower extremity, left hip  was performed today.  Evaluation of the ASIS shows some mild cortical  irregularity with insertional tendinopathy of the sartorius.  There is no  gross tearing of the sartorius noted.  AIIS was evaluated without cortical  irregularity and proper  insertion of the rectus femoris.  Iliac crest has  a small spur with associated hypoechoic fluid surrounding this, no  evidence of fracture.  The right iliac crest has no significant pathology  noted.  *Independent review of bilateral hip x-ray from 05/28/2023 shows minimal arthritic change of bilateral hip joints.  There are some small enthesophytes off the left greater than right superior-lateral iliac crest.  XR HIPS BILAT W OR W/O PELVIS 2V An AP pelvis and lateral of both hips shows normal-appearing hips  bilaterally.  Past Medical/Family/Surgical/Social History: Medications & Allergies reviewed per EMR, new medications updated. Patient Active Problem List   Diagnosis Date Noted   GERD (gastroesophageal reflux disease) 01/04/2022   Chronic cough 01/04/2022   Diabetes mellitus (HCC) 01/04/2022   Hypercholesterolemia 01/04/2022   Prediabetes    Sleep apnea    BPH (benign prostatic hyperplasia)    Past Medical History:  Diagnosis Date   Asthma    BPH (benign prostatic hyperplasia)    Colon polyps    DDD (degenerative disc disease), lumbar    L4-5   Diabetes mellitus without complication (HCC)    GERD (gastroesophageal reflux disease)    Sleep apnea    Family History  Problem Relation Age of Onset   Diabetes Mother    Diabetes Maternal Grandmother    Past Surgical History:  Procedure Laterality Date   VASECTOMY N/A    [employer] 03/31/2020   Social History   Occupational History   Not on file  Tobacco Use   Smoking status: Never   Smokeless tobacco: Never  Substance and Sexual Activity   Alcohol use: Yes    Comment: rare   Drug use: No   Sexual activity: Yes    Comment: married, Chartered certified accountant.

## 2023-06-11 ENCOUNTER — Other Ambulatory Visit: Payer: Self-pay | Admitting: Family Medicine

## 2023-06-12 NOTE — Telephone Encounter (Signed)
Unable to refill per protocol, Rx expired. Discontinued 05/30/23.   Requested Prescriptions  Pending Prescriptions Disp Refills   atorvastatin (LIPITOR) 20 MG tablet [Pharmacy Med Name: ATORVASTATIN 20 MG TABLET] 90 tablet 0    Sig: TAKE 1 TABLET BY MOUTH EVERY DAY     Cardiovascular:  Antilipid - Statins Failed - 06/11/2023  1:56 AM      Failed - Valid encounter within last 12 months    Recent Outpatient Visits           2 years ago General medical exam   [facility] Donita Brooks, MD   2 years ago Diabetes mellitus type 2 with complications (HCC)   [facility] Pickard, Priscille Heidelberg, MD   3 years ago General medical exam   [facility] Donita Brooks, MD   3 years ago Numbness and tingling in right hand   [facility] Pickard, Priscille Heidelberg, MD   4 years ago General medical exam   [facility] Donita Brooks, MD              Failed - Lipid Panel in normal range within the last 12 months    Cholesterol  Date Value Ref Range Status  05/11/2023 131 <200 mg/dL Final   LDL Cholesterol (Calc)  Date Value Ref Range Status  05/11/2023 75 mg/dL (calc) Final    Comment:    Reference range: <100 . Desirable range <100 mg/dL for primary prevention;   <70 mg/dL for patients with CHD or diabetic patients  with > or = 2 CHD risk factors. Marland Kitchen LDL-C is now calculated using the Martin-Hopkins  calculation, which is a validated novel method providing  better accuracy than the Friedewald equation in the  estimation of LDL-C.  Horald Pollen et al. Lenox Ahr. 1610;960(45): 2061-2068  (http://education.[employer].com/faq/FAQ164)    HDL  Date Value Ref Range Status  05/11/2023 35 (L) > OR = 40 mg/dL Final   Triglycerides  Date Value Ref Range Status  05/11/2023 126 <150 mg/dL Final         Passed - Patient is not pregnant

## 2023-06-13 NOTE — Patient Instructions (Signed)
Mr. Melvin Morrison , Thank you for taking time to come for your Medicare Wellness Visit. I appreciate your ongoing commitment to your health goals. Please review the following plan we discussed and let me know if I can assist you in the future.   Referrals/Orders/Follow-Ups/Clinician Recommendations: Aim for 30 minutes of exercise or brisk walking, 6-8 glasses of water, and 5 servings of fruits and vegetables each day.  This is a list of the screening recommended for you and due dates:  Health Maintenance  Topic Date Due   Yearly kidney health urinalysis for diabetes  Never done   Medicare Annual Wellness Visit  06/14/2023*   Colon Cancer Screening  05/13/2024*   Hepatitis C Screening  05/13/2024*   Eye exam for diabetics  05/16/2024*   Hemoglobin A1C  11/11/2023   Yearly kidney function blood test for diabetes  05/10/2024   Complete foot exam   05/13/2024   DTaP/Tdap/Td vaccine (2 - Td or Tdap) 02/27/2028   Pneumonia Vaccine  Completed   Flu Shot  Completed   COVID-19 Vaccine  Completed   Zoster (Shingles) Vaccine  Completed   HPV Vaccine  Aged Out  *Topic was postponed. The date shown is not the original due date.    Advanced directives: (ACP Link)Information on Advanced Care Planning can be found at [facility] Advance Health Care Directives Advance Health Care Directives ([email or web address]   Next Medicare Annual Wellness Visit scheduled for next year: Yes

## 2023-06-14 ENCOUNTER — Ambulatory Visit (INDEPENDENT_AMBULATORY_CARE_PROVIDER_SITE_OTHER): Payer: Medicare Other

## 2023-06-14 VITALS — Ht 73.0 in | Wt 266.0 lb

## 2023-06-14 DIAGNOSIS — Z0000 Encounter for general adult medical examination without abnormal findings: Secondary | ICD-10-CM

## 2023-06-14 NOTE — Progress Notes (Signed)
Subjective:   Melvin Morrison is a 72 y.o. male who presents for Medicare Annual/Subsequent preventive examination.  Visit Complete: Virtual  I connected with  Melvin Morrison on 06/14/23 by a audio enabled telemedicine application and verified that I am speaking with the correct person using two identifiers.  Patient Location: Home  Provider Location: Office/Clinic  I discussed the limitations of evaluation and management by telemedicine. The patient expressed understanding and agreed to proceed.  Because this visit was a virtual/telehealth visit, some criteria may be missing or patient reported. Any vitals not documented were not able to be obtained and vitals that have been documented are patient reported.   Cardiac Risk Factors include: advanced age (>65men, >59 women);dyslipidemia;hypertension;diabetes mellitus;male gender     Objective:    Today's Vitals   06/14/23 1120  Weight: 266 lb (120.7 kg)  Height: 6\' 1"  (1.854 m)   Body mass index is 35.09 kg/m.     06/14/2023   12:05 PM 04/07/2021    3:21 PM 04/01/2020    3:07 PM  Advanced Directives  Does Patient Have a Medical Advance Directive? No Yes No;Yes  Type of Special educational needs teacher of [place];Living will Healthcare Power of [place];Living will;Out of facility DNR (pink MOST or yellow form)  Does patient want to make changes to medical advance directive?  No - Patient declined   Copy of Healthcare Power of Attorney in Chart?  No - copy requested   Would patient like information on creating a medical advance directive? Yes (MAU/Ambulatory/Procedural Areas - Information given)      Current Medications (verified) Outpatient Encounter Medications as of 06/14/2023  Medication Sig   albuterol (PROAIR HFA) 108 (90 Base) MCG/ACT inhaler INHALE 2 PUFFS INTO THE LUNGS EVERY 6 HOURS AS NEEDED FOR WHEEZING OR SHORTNESS OF BREATH   alfuzosin (UROXATRAL) 10 MG 24 hr tablet Take 10 mg by mouth daily. RX'd by Dr.  Annabell Morrison   blood glucose meter kit and supplies KIT Dispense based on patient and insurance preference. Use up to four times daily as directed.   clotrimazole-betamethasone (LOTRISONE) cream APPLY TO AFFECTED AREA TWICE A DAY   esomeprazole (NEXIUM) 40 MG capsule TAKE 1 CAPSULE BY MOUTH EVERY DAY   JARDIANCE 25 MG TABS tablet TAKE 1 TABLET BY MOUTH EVERY DAY BEFORE BREAKFAST   meloxicam (MOBIC) 15 MG tablet Take 1 tablet (15 mg total) by mouth daily.   montelukast (SINGULAIR) 10 MG tablet TAKE 1 TABLET BY MOUTH EVERYDAY AT BEDTIME   rosuvastatin (CRESTOR) 40 MG tablet Take 1 tablet (40 mg total) by mouth daily.   tirzepatide (MOUNJARO) 7.5 MG/0.5ML Pen Inject 7.5 mg into the skin once a week.   [DISCONTINUED] doxycycline (VIBRA-TABS) 100 MG tablet Take 1 tablet (100 mg total) by mouth 2 (two) times daily. (Patient not taking: Reported on 05/11/2022)   [DISCONTINUED] metFORMIN (GLUCOPHAGE-XR) 500 MG 24 hr tablet TAKE 2 TABLETS( 1000MG ) BY MOUTH EVERY MORNING FOR 2 WEEKS THEN INCREASE TO 4 DAILY   [DISCONTINUED] Testosterone 20.25 MG/ACT (1.62%) GEL RX'd by Dr. Annabell Morrison (Patient not taking: Reported on 05/14/2023)   [DISCONTINUED] traZODone (DESYREL) 50 MG tablet Take 1 tablet (50 mg total) by mouth at bedtime. (Patient not taking: Reported on 05/14/2023)   Facility-Administered Encounter Medications as of 06/14/2023  Medication   betamethasone acetate-betamethasone sodium phosphate (CELESTONE) injection 3 mg    Allergies (verified) Patient has no known allergies.   History: Past Medical History:  Diagnosis Date   Asthma  BPH (benign prostatic hyperplasia)    Colon polyps    DDD (degenerative disc disease), lumbar    L4-5   Diabetes mellitus without complication (HCC)    GERD (gastroesophageal reflux disease)    Sleep apnea    Past Surgical History:  Procedure Laterality Date   VASECTOMY N/A    Phreesia 03/31/2020   Family History  Problem Relation Age of Onset   Diabetes Mother     Diabetes Maternal Grandmother    Social History   Socioeconomic History   Marital status: Single    Spouse name: Not on file   Number of children: Not on file   Years of education: Not on file   Highest education level: Not on file  Occupational History   Not on file  Tobacco Use   Smoking status: Never   Smokeless tobacco: Never  Substance and Sexual Activity   Alcohol use: Yes    Comment: rare   Drug use: No   Sexual activity: Yes    Comment: married, Chartered certified accountant.  Other Topics Concern   Not on file  Social History Narrative   Not on file   Social Determinants of Health   Financial Resource Strain: Low Risk  (06/14/2023)   Overall Financial Resource Strain (CARDIA)    Difficulty of Paying Living Expenses: Not hard at all  Food Insecurity: No Food Insecurity (06/14/2023)   Hunger Vital Sign    Worried About Running Out of Food in the Last Year: Never true    Ran Out of Food in the Last Year: Never true  Transportation Needs: No Transportation Needs (06/14/2023)   PRAPARE - Administrator, Civil Service (Medical): No    Lack of Transportation (Non-Medical): No  Physical Activity: Inactive (06/14/2023)   Exercise Vital Sign    Days of Exercise per Week: 0 days    Minutes of Exercise per Session: 0 min  Stress: No Stress Concern Present (06/14/2023)   [employer] of Occupational Health - Occupational Stress Questionnaire    Feeling of Stress : Not at all  Social Connections: Moderately Integrated (06/14/2023)   Social Connection and Isolation Panel [NHANES]    Frequency of Communication with Friends and Family: More than three times a week    Frequency of Social Gatherings with Friends and Family: Three times a week    Attends Religious Services: More than 4 times per year    Active Member of Clubs or Organizations: No    Attends Banker Meetings: Never    Marital Status: Married    Tobacco Counseling Counseling given: Not  Answered   Clinical Intake:  Pre-visit preparation completed: Yes  Pain : No/denies pain     Diabetes: Yes CBG done?: No Did pt. bring in CBG monitor from home?: No  How often do you need to have someone help you when you read instructions, pamphlets, or other written materials from your doctor or pharmacy?: 1 - Never  Interpreter Needed?: No  Information entered by :: Kandis Fantasia LPN   Activities of Daily Living    06/14/2023   12:09 PM  In your present state of health, do you have any difficulty performing the following activities:  Hearing? 0  Vision? 0  Difficulty concentrating or making decisions? 0  Walking or climbing stairs? 0  Dressing or bathing? 0  Doing errands, shopping? 0  Preparing Food and eating ? N  Using the Toilet? N  In the past six months, have you accidently  leaked urine? N  Do you have problems with loss of bowel control? N  Managing your Medications? N  Managing your Finances? N  Housekeeping or managing your Housekeeping? N    Patient Care Team: Donita Brooks, MD as PCP - General (Family Medicine) Carman Ching, OD (Optometry) Gastroenterology, Kathe Mariner, Jonny Ruiz, MD as Attending Physician (Urology)  Indicate any recent Medical Services you may have received from other than [facility] providers in the past year (date may be approximate).     Assessment:   This is a routine wellness examination for Haydn.  Hearing/Vision screen Hearing Screening - Comments:: Denies hearing difficulties   Vision Screening - Comments:: Wears rx glasses - up to date with routine eye exams with Dr. Hyacinth Meeker     Goals Addressed             This Visit's Progress    Increase physical activity        Depression Screen    06/14/2023   11:51 AM 05/14/2023    9:36 AM 05/11/2022    9:52 AM 04/10/2022    3:07 PM 04/07/2021    3:19 PM 04/01/2020    3:08 PM 02/26/2018    8:13 AM  PHQ 2/9 Scores  PHQ - 2 Score 0 0 0 2 1 0 0  PHQ- 9 Score   6 7        Fall Risk    06/14/2023   12:05 PM 05/14/2023    9:36 AM 05/11/2022    9:40 AM 04/10/2022    2:59 PM 04/06/2022    9:06 PM  Fall Risk   Falls in the past year? 0 0 0 0 0  Number falls in past yr: 0 0 0 0 0  Injury with Fall? 0 0 0 0 0  Risk for fall due to : No Fall Risks No Fall Risks     Follow up Falls prevention discussed;Education provided;Falls evaluation completed        MEDICARE RISK AT HOME: Medicare Risk at Home Any stairs in or around the home?: No If so, are there any without handrails?: No Home free of loose throw rugs in walkways, pet beds, electrical cords, etc?: Yes Adequate lighting in your home to reduce risk of falls?: Yes Life alert?: No Use of a cane, walker or w/c?: No Grab bars in the bathroom?: Yes Shower chair or bench in shower?: No Elevated toilet seat or a handicapped toilet?: No  TIMED UP AND GO:  Was the test performed?  No    Cognitive Function:        06/14/2023   12:05 PM  6CIT Screen  What Year? 0 points  What month? 0 points  What time? 0 points  Count back from 20 0 points  Months in reverse 0 points  Repeat phrase 0 points  Total Score 0 points    Immunizations Immunization History  Administered Date(s) Administered   Fluad Quad(high Dose 65+) 05/20/2019, 06/09/2020, 06/08/2021   Influenza, High Dose Seasonal PF 06/06/2023   Influenza,inj,Quad PF,6+ Mos 06/14/2017, 06/11/2018, 07/03/2022   Influenza-Unspecified 06/26/2012, 06/29/2015, 07/06/2016   PFIZER Comirnaty(Gray Top)Covid-19 Tri-Sucrose Vaccine 12/31/2020   PFIZER(Purple Top)SARS-COV-2 Vaccination 10/31/2019, 11/23/2019   Pfizer Covid-19 Vaccine Bivalent Booster 81yrs & up 06/13/2021, 02/02/2022   Pfizer(Comirnaty)Fall Seasonal Vaccine 12 years and older 08/01/2022, 06/06/2023   Pneumococcal Conjugate-13 03/27/2019   Pneumococcal Polysaccharide-23 12/25/2013, 02/26/2018   Respiratory Syncytial Virus Vaccine,Recomb Aduvanted(Arexvy) 08/15/2022   Tdap 02/26/2018    Zoster Recombinant(Shingrix) 03/26/2018,  05/28/2018    TDAP status: Up to date  Flu Vaccine status: Up to date  Pneumococcal vaccine status: Up to date  Covid-19 vaccine status: Completed vaccines  Qualifies for Shingles Vaccine? Yes   Zostavax completed No   Shingrix Completed?: Yes  Screening Tests Health Maintenance  Topic Date Due   Diabetic kidney evaluation - Urine ACR  Never done   Colonoscopy  05/13/2024 (Originally 09/19/2019)   Hepatitis C Screening  05/13/2024 (Originally 08/13/1969)   OPHTHALMOLOGY EXAM  05/16/2024 (Originally 05/12/2023)   HEMOGLOBIN A1C  11/11/2023   Diabetic kidney evaluation - eGFR measurement  05/10/2024   FOOT EXAM  05/13/2024   Medicare Annual Wellness (AWV)  06/13/2024   DTaP/Tdap/Td (2 - Td or Tdap) 02/27/2028   Pneumonia Vaccine 69+ Years old  Completed   INFLUENZA VACCINE  Completed   COVID-19 Vaccine  Completed   Zoster Vaccines- Shingrix  Completed   HPV VACCINES  Aged Out    Health Maintenance  Health Maintenance Due  Topic Date Due   Diabetic kidney evaluation - Urine ACR  Never done    Colorectal cancer screening: Type of screening: Colonoscopy. Completed 05/25/20. Repeat every 3 years (records requested from [facility])  Lung Cancer Screening: (Low Dose CT Chest recommended if Age 67-80 years, 20 pack-year currently smoking OR have quit w/in 15years.) does not qualify.   Lung Cancer Screening Referral: n/a  Additional Screening:  Hepatitis C Screening: does qualify;   Vision Screening: Recommended annual ophthalmology exams for early detection of glaucoma and other disorders of the eye. Is the patient up to date with their annual eye exam?  Yes  Who is the provider or what is the name of the office in which the patient attends annual eye exams? Dr. Jimmye Norman If pt is not established with a provider, would they like to be referred to a provider to establish care? No .   Dental Screening: Recommended annual dental exams  for proper oral hygiene  Diabetic Foot Exam: Diabetic Foot Exam: Completed 05/14/23  Community Resource Referral / Chronic Care Management: CRR required this visit?  No   CCM required this visit?  No     Plan:     I have personally reviewed and noted the following in the patient's chart:   Medical and social history Use of alcohol, tobacco or illicit drugs  Current medications and supplements including opioid prescriptions. Patient is not currently taking opioid prescriptions. Functional ability and status Nutritional status Physical activity Advanced directives List of other physicians Hospitalizations, surgeries, and ER visits in previous 12 months Vitals Screenings to include cognitive, depression, and falls Referrals and appointments  In addition, I have reviewed and discussed with patient certain preventive protocols, quality metrics, and best practice recommendations. A written personalized care plan for preventive services as well as general preventive health recommendations were provided to patient.     Kandis Fantasia [place], [place]   10/18/8655   After Visit Summary: (MyChart) Due to this being a telephonic visit, the after visit summary with patients personalized plan was offered to patient via MyChart   Nurse Notes: Patient would like more clarification on Cardiac CT results and what effects change in medication can have on cholesterol.

## 2023-06-25 ENCOUNTER — Other Ambulatory Visit: Payer: Self-pay | Admitting: Sports Medicine

## 2023-07-11 ENCOUNTER — Other Ambulatory Visit: Payer: Self-pay

## 2023-07-11 ENCOUNTER — Telehealth: Payer: Self-pay

## 2023-07-11 DIAGNOSIS — E118 Type 2 diabetes mellitus with unspecified complications: Secondary | ICD-10-CM

## 2023-07-11 MED ORDER — TIRZEPATIDE 10 MG/0.5ML ~~LOC~~ SOAJ: 10.0000 mg | SUBCUTANEOUS | 1 refills | Status: DC

## 2023-07-11 NOTE — Telephone Encounter (Signed)
Pt called in to make pcp aware that pt has been tolerating  this med well and would like to know if pcp is going to increase the dosage upon refilling this med.Pt also asks if the refill can be a 3 month supply.  tirzepatide [facility]) 7.5 MG/0.5ML Pen [[id number]]  LOV: 05/14/23 CPE  PHARMACY:  [id number] - [place], [place] - [address] AT [facility] [address] [place], [place] [address] Phone: [phone]  Fax: [phone] DEA #: [id number]  DAW Reason: --    CB#: [phone]

## 2023-08-28 ENCOUNTER — Other Ambulatory Visit: Payer: Self-pay | Admitting: Family Medicine

## 2023-08-28 DIAGNOSIS — J4520 Mild intermittent asthma, uncomplicated: Secondary | ICD-10-CM

## 2023-09-10 ENCOUNTER — Other Ambulatory Visit: Payer: Self-pay

## 2023-09-10 ENCOUNTER — Telehealth: Payer: Self-pay

## 2023-09-10 DIAGNOSIS — E1165 Type 2 diabetes mellitus with hyperglycemia: Secondary | ICD-10-CM

## 2023-09-10 DIAGNOSIS — E118 Type 2 diabetes mellitus with unspecified complications: Secondary | ICD-10-CM

## 2023-09-10 MED ORDER — EMPAGLIFLOZIN 25 MG PO TABS: 25.0000 mg | ORAL_TABLET | Freq: Every day | ORAL | 5 refills | Status: DC

## 2023-09-10 NOTE — Telephone Encounter (Signed)
Prescription Request  09/10/2023  LOV: 05/14/23  What is the name of the medication or equipment? JARDIANCE 25 MG TABS tablet [[id number]]   Have you contacted your pharmacy to request a refill? Yes   Which pharmacy would you like this sent to?  [id number] [place], [place] - [address] AT [address] [place] [address] Phone: [phone] Fax: [phone]    Patient notified that their request is being sent to the clinical staff for review and that they should receive a response within 2 business days.   Please advise at [facility] [phone]

## 2023-10-07 ENCOUNTER — Other Ambulatory Visit: Payer: Self-pay | Admitting: Family Medicine

## 2023-10-07 DIAGNOSIS — E118 Type 2 diabetes mellitus with unspecified complications: Secondary | ICD-10-CM

## 2023-10-08 NOTE — Telephone Encounter (Signed)
Requested medication (s) are due for refill today: routing for review, PA needed  Requested medication (s) are on the active medication list: yes  Last refill:  07/11/23  Future visit scheduled: no  Notes to clinic:    Pharmacy comment: Alternative Requested:NEEDS PA.       Requested Prescriptions  Pending Prescriptions Disp Refills   MOUNJARO 10 MG/0.5ML Pen [Pharmacy Med Name: MOUNJARO 10 MG/0.5 ML PEN]  1    Sig: INJECT 10 MG INTO THE SKIN ONE TIME PER WEEK     Off-Protocol Failed - 10/08/2023  9:37 AM      Failed - Medication not assigned to a protocol, review manually.      Failed - Valid encounter within last 12 months    Recent Outpatient Visits           2 years ago General medical exam   [facility] Donita Brooks, MD   2 years ago Diabetes mellitus type 2 with complications [facility])   [facility] Pickard, Priscille Heidelberg, MD   3 years ago General medical exam   [facility] Donita Brooks, MD   4 years ago Numbness and tingling in right hand   [facility] Pickard, Priscille Heidelberg, MD   4 years ago General medical exam   [facility] Pickard, Priscille Heidelberg, MD

## 2023-10-19 ENCOUNTER — Other Ambulatory Visit: Payer: Self-pay | Admitting: Urology

## 2023-11-22 ENCOUNTER — Other Ambulatory Visit: Payer: Self-pay | Admitting: Family Medicine

## 2023-11-25 NOTE — Progress Notes (Addendum)
 COVID Vaccine received:  []  No [x]  Yes Date of any COVID positive Test in last 90 days:  PCP - Lynnea Ferrier, MD  Cardiologist - None  Chest x-ray -  EKG -  02-26-2018  Epic   will repeat Stress Test -  ECHO -  Cardiac Cath -  CT calcium score of 400 on 05-23-2023  No Cardiology referral or appts noted.   PCR screen: []  Ordered & Completed []   No Order but Needs PROFEND     [x]   N/A for this surgery  Surgery Plan:  []  Ambulatory   [x]  Outpatient in bed  []  Admit Anesthesia:    [x]  General  []  Spinal  []   Choice []   MAC  Bowel Prep - [x]  No  []   Yes ______  Pacemaker / ICD device [x]  No []  Yes   Spinal Cord Stimulator:[x]  No []  Yes       History of Sleep Apnea? []  No [x]  Yes  study 2019 CPAP used?- []  No []  Yes    Does the patient monitor blood sugar?   []  N/A   []  No []  Yes  Patient has: []  NO Hx DM   []  Pre-DM   []  DM1  [x]   DM2 Last A1c was:   7.6 on   05-11-2023   Does patient have a [employer] or Dexcom? []  No []  Yes   Fasting Blood Sugar Ranges-  Checks Blood Sugar _____ times a day  GLP1 agonist / usual dose - Mounjaro, hold 7-10 days at least.   SGLT-2 inhibitors / usual dose - Jardiance, hold x 72 hours,  last dose will be on Monday 12-03-23  Blood Thinner / Instructions:none Aspirin Instructions: none  ERAS Protocol Ordered: [x]  No  []  Yes Patient is to be NPO after: midnight prior  Dental hx: []  Dentures:  []  N/A      []  Bridge or Partial:                   []  Loose or Damaged teeth:   Comments:   Activity level: Patient is able / unable to climb a flight of stairs without difficulty; []  No CP  []  No SOB, but would have ___   Patient can / can not perform ADLs without assistance.   Anesthesia review: DM2, GERD, asthma, CAD- calcium score 400- no workup found, OSA-  ,  Patient denies shortness of breath, fever, cough and chest pain at PAT appointment.  Patient verbalized understanding and agreement to the Pre-Surgical Instructions that were given to  them at this PAT appointment. Patient was also educated of the need to review these PAT instructions again prior to his surgery.I reviewed the appropriate phone numbers to call if they have any and questions or concerns.

## 2023-11-25 NOTE — Patient Instructions (Signed)
 SURGICAL WAITING ROOM VISITATION Patients having surgery or a procedure may have no more than 2 support people in the waiting area - these visitors may rotate in the visitor waiting room.   Due to an increase in RSV and influenza rates and associated hospitalizations, children ages 65 and under may not visit patients in [facility] hospitals. If the patient needs to stay at the hospital during part of their recovery, the visitor guidelines for inpatient rooms apply.  PRE-OP VISITATION  Pre-op nurse will coordinate an appropriate time for 1 support person to accompany the patient in pre-op.  This support person may not rotate.  This visitor will be contacted when the time is appropriate for the visitor to come back in the pre-op area.  Please refer to the [facility] website for the visitor guidelines for Inpatients (after your surgery is over and you are in a regular room).  You are not required to quarantine at this time prior to your surgery. However, you must do this: Hand Hygiene often Do NOT share personal items Notify your provider if you are in close contact with someone who has COVID or you develop fever 100.4 or greater, new onset of sneezing, cough, sore throat, shortness of breath or body aches.  If you test positive for Covid or have been in contact with anyone that has tested positive in the last 10 days please notify you surgeon.    Your procedure is scheduled on:  FRIDAY  December 07, 2023  Report to [facility] Main Entrance: Leota [facility]sen entrance where the [employer] is available.   Report to admitting at: 06:45    AM  Call this number if you have any questions or problems the morning of surgery [phone]  DO NOT EAT OR DRINK ANYTHING AFTER MIDNIGHT THE NIGHT PRIOR TO YOUR SURGERY / PROCEDURE.   FOLLOW  ANY ADDITIONAL PRE OP INSTRUCTIONS YOU RECEIVED FROM YOUR SURGEON'S OFFICE!!!   Oral Hygiene is also important to reduce your risk of infection.        Remember  - BRUSH YOUR TEETH THE MORNING OF SURGERY WITH YOUR REGULAR TOOTHPASTE  Do NOT smoke after Midnight the night before surgery.  STOP TAKING all Vitamins, Herbs and supplements 1 week before your surgery.   Take ONLY these medicines the morning of surgery with A SIP OF WATER: esomeprazole, Alfuzosin (Uroxatral), and you may use your Albuterol inhaler (Please bring your inhaler with you on the surgery day)   If You have been diagnosed with Sleep Apnea - Bring CPAP mask and tubing day of surgery. We will provide you with a CPAP machine on the day of your surgery.                   You may not have any metal on your body including jewelry, and body piercing  Do not wear  lotions, powders, cologne, or deodorant  Men may shave face and neck.  Contacts, Hearing Aids, dentures or bridgework may not be worn into surgery. DENTURES WILL BE REMOVED PRIOR TO SURGERY PLEASE DO NOT APPLY "Poly grip" OR ADHESIVES!!!  You may bring a small overnight bag with you on the day of surgery, only pack items that are not valuable. [facility] IS NOT RESPONSIBLE   FOR VALUABLES THAT ARE LOST OR STOLEN.   Do not bring your home medications to the hospital. The Pharmacy will dispense medications listed on your medication list to you during your admission in the Hospital.  Please read over the following fact sheets you were given: IF YOU HAVE QUESTIONS ABOUT YOUR PRE-OP INSTRUCTIONS, PLEASE CALL [phone].   [facility] - Preparing for Surgery Before surgery, you can play an important role.  Because skin is not sterile, your skin needs to be as free of germs as possible.  You can reduce the number of germs on your skin by washing with CHG (chlorahexidine gluconate) soap before surgery.  CHG is an antiseptic cleaner which kills germs and bonds with the skin to continue killing germs even after washing. Please DO NOT use if you have an allergy to CHG or antibacterial soaps.  If your skin becomes reddened/irritated  stop using the CHG and inform your nurse when you arrive at Short Stay. Do not shave (including legs and underarms) for at least 48 hours prior to the first CHG shower.  You may shave your face/neck.  Please follow these instructions carefully:  1.  Shower with CHG Soap the night before surgery and the  morning of surgery.  2.  If you choose to wash your hair, wash your hair first as usual with your normal  shampoo.  3.  After you shampoo, rinse your hair and body thoroughly to remove the shampoo.                             4.  Use CHG as you would any other liquid soap.  You can apply chg directly to the skin and wash.  Gently with a scrungie or clean washcloth.  5.  Apply the CHG Soap to your body ONLY FROM THE NECK DOWN.   Do not use on face/ open                           Wound or open sores. Avoid contact with eyes, ears mouth and genitals (private parts).                       Wash face,  Genitals (private parts) with your normal soap.             6.  Wash thoroughly, paying special attention to the area where your  surgery  will be performed.  7.  Thoroughly rinse your body with warm water from the neck down.  8.  DO NOT shower/wash with your normal soap after using and rinsing off the CHG Soap.            9.  Pat yourself dry with a clean towel.            10.  Wear clean pajamas.            11.  Place clean sheets on your bed the night of your first shower and do not  sleep with pets.  ON THE DAY OF SURGERY : Do not apply any lotions/deodorants the morning of surgery.  Please wear clean clothes to the hospital/surgery center.    FAILURE TO FOLLOW THESE INSTRUCTIONS MAY RESULT IN THE CANCELLATION OF YOUR SURGERY  PATIENT SIGNATURE_________________________________  NURSE SIGNATURE__________________________________  ________________________________________________________________________

## 2023-11-26 ENCOUNTER — Other Ambulatory Visit: Payer: Self-pay

## 2023-11-26 ENCOUNTER — Encounter (HOSPITAL_COMMUNITY): Payer: Self-pay

## 2023-11-26 ENCOUNTER — Encounter (HOSPITAL_COMMUNITY)
Admission: RE | Admit: 2023-11-26 | Discharge: 2023-11-26 | Disposition: A | Discharge status: Home / Self Care | Payer: Medicare Other | Source: Ambulatory Visit | Attending: Urology | Admitting: Urology

## 2023-11-26 VITALS — BP 106/68 | HR 80 | Temp 98.6°F | Resp 16 | Ht 73.0 in | Wt 241.0 lb

## 2023-11-26 DIAGNOSIS — Z01818 Encounter for other preprocedural examination: Secondary | ICD-10-CM | POA: Insufficient documentation

## 2023-11-26 DIAGNOSIS — I2510 Atherosclerotic heart disease of native coronary artery without angina pectoris: Secondary | ICD-10-CM | POA: Diagnosis not present

## 2023-11-26 DIAGNOSIS — E119 Type 2 diabetes mellitus without complications: Secondary | ICD-10-CM | POA: Diagnosis not present

## 2023-11-26 HISTORY — DX: Atherosclerotic heart disease of native coronary artery without angina pectoris: I25.10

## 2023-11-26 HISTORY — DX: Pneumonia, unspecified organism: J18.9

## 2023-11-26 LAB — CBC
HCT: 49.1 % (ref 39.0–52.0)
Hemoglobin: 15.9 g/dL (ref 13.0–17.0)
MCH: 29.7 pg (ref 26.0–34.0)
MCHC: 32.4 g/dL (ref 30.0–36.0)
MCV: 91.8 fL (ref 80.0–100.0)
Platelets: 252 10*3/uL (ref 150–400)
RBC: 5.35 MIL/uL (ref 4.22–5.81)
RDW: 12.8 % (ref 11.5–15.5)
WBC: 6.7 10*3/uL (ref 4.0–10.5)
nRBC: 0 % (ref 0.0–0.2)

## 2023-11-26 LAB — BASIC METABOLIC PANEL
Anion gap: 11 (ref 5–15)
BUN: 16 mg/dL (ref 8–23)
CO2: 22 mmol/L (ref 22–32)
Calcium: 9.4 mg/dL (ref 8.9–10.3)
Chloride: 107 mmol/L (ref 98–111)
Creatinine, Ser: 1.06 mg/dL (ref 0.61–1.24)
GFR, Estimated: >60 mL/min (ref >60)
Glucose, Bld: 119 mg/dL — ABNORMAL HIGH (ref 70–99)
Potassium: 3.7 mmol/L (ref 3.5–5.1)
Sodium: 140 mmol/L (ref 135–145)

## 2023-11-26 LAB — HEMOGLOBIN A1C
Hgb A1c MFr Bld: 6.1 % — ABNORMAL HIGH (ref 4.8–5.6)
Mean Plasma Glucose: 128.37 mg/dL

## 2023-11-26 LAB — GLUCOSE, CAPILLARY: Glucose-Capillary: 107 mg/dL — ABNORMAL HIGH (ref 70–99)

## 2023-11-27 ENCOUNTER — Telehealth: Payer: Self-pay
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
## 2023-11-27 NOTE — Telephone Encounter (Signed)
 LM for Bjorn Loser at [facility] returning call. Mjp,lpn  Copied from CRM [id number]. Topic: General - Other >> Nov 27, 2023  2:06 PM Franchot Heidelberg wrote: Reason for CRM: Bjorn Loser from [facility] called reporting that he needs medical clearance for his procedure prior to 12/07/2023.  Best contact: [phone] ext [phone]  Has questions for clinic, requesting call back for clarification.

## 2023-11-28 ENCOUNTER — Telehealth: Payer: Self-pay

## 2023-11-28 NOTE — Telephone Encounter (Signed)
 Copied from CRM [id number]. Topic: General - Other >> Nov 28, 2023  9:31 AM Shon Hale wrote: Reason for CRM: Patient calling to speak to Germaine Pomfret.   Requesting call back: [phone]

## 2023-11-30 ENCOUNTER — Encounter: Payer: Self-pay | Admitting: Family Medicine

## 2023-11-30 ENCOUNTER — Ambulatory Visit: Admitting: Family Medicine

## 2023-11-30 VITALS — BP 120/62 | HR 72 | Temp 98.4°F | Ht 73.0 in | Wt 245.0 lb

## 2023-11-30 DIAGNOSIS — E118 Type 2 diabetes mellitus with unspecified complications: Secondary | ICD-10-CM

## 2023-11-30 DIAGNOSIS — Z794 Long term (current) use of insulin: Secondary | ICD-10-CM
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
## 2023-12-01 LAB — LIPID PANEL
Cholesterol: 91 mg/dL (ref <200)
HDL: 43 mg/dL (ref >=40)
LDL Cholesterol (Calc): 32 mg/dL
Non-HDL Cholesterol (Calc): 48 mg/dL (ref <130)
Total CHOL/HDL Ratio: 2.1 (calc) (ref <5.0)
Triglycerides: 78 mg/dL (ref <150)

## 2023-12-05 NOTE — H&P (Signed)
 10/17/23: Melvin Morrison returns today for cystoscopy to assess his LUTs. His IPSS is 20 with a reduced stream and frequency. He didn't get the Urocuff or prostate [place]. He remains on alfuzosin for the voiding symptoms. Hiis PSA is 0.91. His PF is 43ml/sec after cystoscopy.     AUA Symptom Score: 50% of the time he has the sensation of not emptying his bladder completely when finished urinating. Almost always he has to urinate again fewer than two hours after he has finished urinating. Less than 20% of the time he has to start and stop again several times when he urinates. More than 50% of the time he finds it difficult to postpone urination. Almost always he has a weak urinary stream. He never has to push or strain to begin urination. He has to get up to urinate 2 times from the time he goes to bed until the time he gets up in the morning.   Calculated AUA Symptom Score: 20    ALLERGIES: No Allergies    MEDICATIONS: Albuterol Sulfate  Alfuzosin Hcl Er 10 mg tablet, extended release 24 hr 1 tablet PO Daily  Alfuzosin Hcl Er  Atorvastatin Calcium 20 mg tablet  Clotrimazole  Esomeprazole Sodium  Jardiance 25 mg tablet 1 tablet PO Daily  Meloxicam  Montelukast Sodium  Mounjaro  Proair Hfa PRN  Rosuvastatin Calcium  Sildenafil Citrate 100 mg tablet 1 tablet as needed  Triamcinolone Acetonide 0.1 % ointment     GU PSH: Vasectomy - 2008       PSH Notes: Surgery Of Male Genitalia Vasectomy, Colonoscopy (Fiberoptic)   NON-GU PSH: Diagnostic Colonoscopy - 2008 Knee Arthroscopy, Right - about 2021 Visit Complexity (formerly GPC1X) - 07/16/2023     GU PMH: BPH w/LUTS, His symptoms have worsened. His PSA is <1 so he is unlikely to benefit from finasteride. I will have him return with a Urocuff and Prostate Korea for a cystoscopy. - 07/16/2023, Alfuzosin refilled. PSA in a year. , - 07/10/2022, afluzosin refilled. f/u in 1 year with labs. , - 06/29/2021, He has stable LUTS with a weak stream on alfuzosin  but he is not interested in pursuing other options. , - 2021 (Stable), He is voiding with stable symptoms on alfuzosin but he is concerned about the fatigue. I am going to try to get him a script for Rapaflo 8mg  which is less likely to cause fatigue, but I believe the fatigue is more likely to be secondary to his deconditioning and being overweight so I recommended exercise and diet. , - 2018 (Stable), He is voiding well on alfuzosin., - 2017, Benign prostatic hyperplasia with urinary obstruction, - 2016 ED due to arterial insufficiency, sildenafil refilled. - 07/16/2023, Sildenafil refilled. , - 07/10/2022, Sildenafil refilled. , - 06/29/2021, Sildenafil refilled. , - 2021, He has not be active., - 2018 (Stable), He hasn't been using the Cialis because of cost., - 2017, Erectile dysfunction due to arterial insufficiency, - 2016 Primary hypogonadism, His T remains moderately low. - 07/16/2023, His testosterone remains low but is up some, possibly related to his 40lb weight loss since the first of the year. I will recheck in a year. , - 07/10/2022, His T is down off of androgel and he doesn't want to resume because of the breast enlargement. I encouraged him to lose weight if possible as that should help the T level and the breast enlargement. , - 06/29/2021, He has not been compliant with treatment. I will refill the testosterone and repeat labs in  a month and then in 6 months. , - 2021, His T is up above normal on no therapy. I will repeat that level today and notify him of the results. , - 2019 (Improving), He has had a good response to the anastrazole. He will continue that I and I will repeat labs in 6 months. , - 2019, - 2018 Urinary Urgency (Stable) - 07/16/2023, - 07/10/2022 Weak Urinary Stream (Stable) - 07/16/2023, - 07/10/2022, - 06/29/2021 (Stable), - 2021, Weak urinary stream, - 2016 History of urolithiasis, He has had no stone symptoms and has a clear urine today. - 07/10/2022, - 06/29/2021, He may  have passed a stone a few weeks ago but has no symptoms now. , - 2021, He has had no worrisome symptoms and his UA is clear. 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 I discussed the options for treatment including using an aromatase inhibitor vs diet and exercise. We will start with anastrazole but I have recommended he consider the DASH diet. He will return in 2 months with repeat labs. - 2018 Low testosterone - 2018 Other fatigue, This is a new complaint to me. I will repeat his testosterone since it was low in 2016 and also check a TSH. - 2018 Encounter for general adult medical examination without abnormal findings, Encounter for preventive health examination - 2016 Asthma, Asthma - 2014 Personal history of other diseases of the digestive system, History of esophageal reflux - 2014 Personal history of other diseases of the nervous system and sense organs, History of sleep apnea - 2014 Personal history of other infectious and parasitic diseases, History of tinea cruris - 2014 Prediabetes    FAMILY HISTORY: No Family History    SOCIAL HISTORY: Marital Status: Married Preferred Language: English; Race: White Current Smoking Status: Patient has never smoked.  Light Drinker.  Drinks 2 caffeinated drinks per day. Patient's occupation is/was retired.     Notes: Never a smoker, Occupation:, Caffeine Use, Tobacco Use, Marital History - Currently Married, Alcohol Use   REVIEW OF SYSTEMS:    GU Review Male:   Patient reports frequent urination, get up at night to urinate, leakage of urine, and trouble starting your stream. Patient denies hard to postpone urination, burning/  pain with urination, stream starts and stops, have to strain to urinate , erection problems, and penile pain.  Gastrointestinal (Upper):   Patient denies nausea, vomiting, and indigestion/ heartburn.  Gastrointestinal (Lower):   Patient denies diarrhea and constipation.  Constitutional:   Patient denies fever, night sweats, weight loss, and fatigue.  Skin:   Patient denies skin rash/ lesion and itching.  Eyes:   Patient denies blurred vision and double vision.  Ears/ Nose/ Throat:   Patient denies sore throat and sinus problems.  Hematologic/Lymphatic:   Patient denies swollen glands and easy bruising.  Cardiovascular:   Patient denies leg swelling and chest pains.  Respiratory:   Patient denies cough and shortness of breath.  Endocrine:   Patient denies excessive thirst.  Musculoskeletal:   Patient denies back pain and joint pain.  Neurological:   Patient denies headaches and dizziness.  Psychologic:   Patient denies depression and anxiety.   Notes: Urgency, weak stream  VITAL SIGNS: None   MULTI-SYSTEM PHYSICAL EXAMINATION:    Constitutional: Obese. No physical deformities. Normally developed. Good grooming.   Respiratory: Normal breath sounds. No labored breathing, no use of accessory muscles.   Cardiovascular: Regular rate and rhythm. No murmur, no gallop.      Complexity of Data:  Records Review:   AUA Symptom Score, Previous Patient Records  Urine Test Review:   Urinalysis  Urodynamics Review:   Review Flow Rate   07/02/23 07/03/22 06/22/21 08/04/20 06/18/19 06/11/18 06/12/17 03/09/15  PSA  Total PSA 0.91 ng/mL 0.63 ng/mL 0.58 ng/mL 0.60 ng/mL 0.50 ng/mL 0.54 ng/mL 0.69 ng/mL 0.50     06/22/21 01/03/21 08/04/20 08/13/19 06/11/18  Hormones  Testosterone, Total 180.0 ng/dL 657.8 ng/dL 469.6 ng/dL 295.2 ng/dL 8413.2 ng/dL    PROCEDURES:         Flexible Cystoscopy - 52000  Risks, benefits, and some of the potential complications of the procedure were discussed. He was  prepped with betadine and the urethral was instilled with 2% lidocaine jelly. Cipro 500mg  given for antibiotic prophylaxis.     Meatus:  Normal size. Normal location. Normal condition.  Urethra:  No strictures.  External Sphincter:  Normal.  Verumontanum:  Normal.  Prostate:  Obstructing. Mild hyperplasia.  Bladder Neck:  Non-obstructing.  Ureteral Orifices:  Normal location. Normal size. Normal shape. Effluxed clear urine.  Bladder:  Mild trabeculation. No tumors. Normal mucosa. No stones.      The procedure was well tolerated and there were no complications.        Flow Rate - 51741  Voided Volume: 156 cc  Peak Flow Rate: 9 cc/sec  Time of Peak Flow: 0:07 min:sec  Average Flow Rate: 8 cc/sec  Flow Time: 0:18 min:sec  Total Void Time: 0:18 min:sec         Urinalysis - 81003 Dipstick Dipstick Cont'd  Color: Yellow Bilirubin: Neg mg/dL  Appearance: Clear Ketones: Neg mg/dL  Specific Gravity: 4.401 Blood: Neg ery/uL  pH: <=5.0 Protein: Neg mg/dL  Glucose: 3+ mg/dL Urobilinogen: 0.2 mg/dL    Nitrites: Neg    Leukocyte Esterase: Neg leu/uL    ASSESSMENT:      ICD-10 Details  1 GU:   BPH w/LUTS - N40.1 Chronic, Stable - I discussed several options and after the review, he is most interested in a TURP.   I reviewd the risks of a TURP including bleeding, infection, incontinence, stricture, need for secondary procedures, ejaculatory and erectile dysfunction, thrombotic events, fluid overload and anesthetic complications. I explained that 95% of men will have relief of the obstructive symptoms and about 70% will have relief of the irritative symptoms.    2   Weak Urinary Stream - R39.12 Chronic, Stable  3   Urinary Urgency - R39.15 Chronic, Stable   PLAN:           Schedule Return Visit/Planned Activity: Next Available Appointment - Schedule Surgery  Procedure: Unspecified Date - Cystoscopy TURP - 02725 Notes: Next available.

## 2023-12-07 ENCOUNTER — Encounter (HOSPITAL_COMMUNITY)
Admission: RE | Disposition: A | Discharge status: Home / Self Care | Payer: Self-pay | Source: Ambulatory Visit | Attending: Urology

## 2023-12-07 ENCOUNTER — Encounter (HOSPITAL_COMMUNITY): Payer: Self-pay | Admitting: Urology

## 2023-12-07 ENCOUNTER — Observation Stay (HOSPITAL_COMMUNITY)
Admission: RE | Admit: 2023-12-07 | Discharge: 2023-12-08 | Disposition: A | Discharge status: Home / Self Care | Payer: Medicare Other | Source: Ambulatory Visit | Attending: Urology | Admitting: Urology

## 2023-12-07 ENCOUNTER — Other Ambulatory Visit: Payer: Self-pay

## 2023-12-07 ENCOUNTER — Ambulatory Visit (HOSPITAL_COMMUNITY): Payer: Self-pay | Admitting: Physician Assistant

## 2023-12-07 ENCOUNTER — Ambulatory Visit (HOSPITAL_BASED_OUTPATIENT_CLINIC_OR_DEPARTMENT_OTHER): Admitting: Anesthesiology

## 2023-12-07 DIAGNOSIS — N401 Enlarged prostate with lower urinary tract symptoms: Secondary | ICD-10-CM

## 2023-12-07 DIAGNOSIS — Z79899 Other long term (current) drug therapy: Secondary | ICD-10-CM | POA: Diagnosis not present

## 2023-12-07 DIAGNOSIS — I2510 Atherosclerotic heart disease of native coronary artery without angina pectoris: Secondary | ICD-10-CM

## 2023-12-07 DIAGNOSIS — E119 Type 2 diabetes mellitus without complications: Secondary | ICD-10-CM

## 2023-12-07 DIAGNOSIS — R3912 Poor urinary stream: Secondary | ICD-10-CM | POA: Insufficient documentation

## 2023-12-07 DIAGNOSIS — N320 Bladder-neck obstruction: Secondary | ICD-10-CM | POA: Insufficient documentation

## 2023-12-07 DIAGNOSIS — J45909 Unspecified asthma, uncomplicated: Secondary | ICD-10-CM | POA: Insufficient documentation

## 2023-12-07 DIAGNOSIS — N138 Other obstructive and reflux uropathy: Secondary | ICD-10-CM | POA: Diagnosis not present

## 2023-12-07 DIAGNOSIS — R3915 Urgency of urination: Secondary | ICD-10-CM | POA: Insufficient documentation

## 2023-12-07 DIAGNOSIS — Z01818 Encounter for other preprocedural examination: Secondary | ICD-10-CM

## 2023-12-07 DIAGNOSIS — N400 Enlarged prostate without lower urinary tract symptoms: Secondary | ICD-10-CM

## 2023-12-07 HISTORY — PX: TRANSURETHRAL RESECTION OF PROSTATE: SHX73

## 2023-12-07 HISTORY — DX: Benign prostatic hyperplasia without lower urinary tract symptoms: N40.0

## 2023-12-07 LAB — GLUCOSE, CAPILLARY
Glucose-Capillary: 122 mg/dL — ABNORMAL HIGH (ref 70–99)
Glucose-Capillary: 108 mg/dL — ABNORMAL HIGH (ref 70–99)
Glucose-Capillary: 155 mg/dL — ABNORMAL HIGH (ref 70–99)
Glucose-Capillary: 190 mg/dL — ABNORMAL HIGH (ref 70–99)

## 2023-12-07 SURGERY — TURP (TRANSURETHRAL RESECTION OF PROSTATE)
Anesthesia: General

## 2023-12-07 MED ORDER — ACETAMINOPHEN 325 MG PO TABS: 650.0000 mg | ORAL_TABLET | ORAL | Status: DC | PRN

## 2023-12-07 MED ORDER — INSULIN ASPART 100 UNIT/ML IJ SOLN
0.0000 [IU] | Freq: Three times a day (TID) | INTRAMUSCULAR | Status: DC
  Administered 2023-12-08: 2 [IU] via SUBCUTANEOUS

## 2023-12-07 MED ORDER — FENTANYL CITRATE PF 50 MCG/ML IJ SOSY
25.0000 ug | PREFILLED_SYRINGE | INTRAMUSCULAR | Status: DC | PRN
  Administered 2023-12-07: 50 ug via INTRAVENOUS

## 2023-12-07 MED ORDER — OXYCODONE HCL 5 MG PO TABS
5.0000 mg | ORAL_TABLET | ORAL | Status: DC | PRN
  Administered 2023-12-07 (×2): 5 mg via ORAL
  Filled 2023-12-07: qty 1

## 2023-12-07 MED ORDER — ONDANSETRON HCL 4 MG/2ML IJ SOLN: 4.0000 mg | Freq: Four times a day (QID) | INTRAMUSCULAR | Status: DC | PRN

## 2023-12-07 MED ORDER — ROSUVASTATIN CALCIUM 20 MG PO TABS
40.0000 mg | ORAL_TABLET | Freq: Every day | ORAL | Status: DC
  Administered 2023-12-08: 40 mg via ORAL
  Filled 2023-12-07: qty 2

## 2023-12-07 MED ORDER — FENTANYL CITRATE (PF) 100 MCG/2ML IJ SOLN
INTRAMUSCULAR | Status: AC
  Filled 2023-12-07: qty 2

## 2023-12-07 MED ORDER — FENTANYL CITRATE (PF) 100 MCG/2ML IJ SOLN
INTRAMUSCULAR | Status: DC | PRN
  Administered 2023-12-07: 100 ug via INTRAVENOUS

## 2023-12-07 MED ORDER — LIDOCAINE HCL (PF) 2 % IJ SOLN
INTRAMUSCULAR | Status: AC
  Filled 2023-12-07: qty 5

## 2023-12-07 MED ORDER — OXYCODONE HCL 5 MG PO TABS
ORAL_TABLET | ORAL | Status: AC
  Filled 2023-12-07: qty 1

## 2023-12-07 MED ORDER — EMPAGLIFLOZIN 25 MG PO TABS
25.0000 mg | ORAL_TABLET | Freq: Every day | ORAL | Status: DC
  Administered 2023-12-08: 25 mg via ORAL
  Filled 2023-12-07: qty 1

## 2023-12-07 MED ORDER — SODIUM CHLORIDE 0.9% FLUSH: 3.0000 mL | INTRAVENOUS | Status: DC | PRN

## 2023-12-07 MED ORDER — CEFAZOLIN SODIUM-DEXTROSE 2-4 GM/100ML-% IV SOLN
2.0000 g | INTRAVENOUS | Status: AC
  Administered 2023-12-07: 2 g via INTRAVENOUS
  Filled 2023-12-07: qty 100

## 2023-12-07 MED ORDER — CHLORHEXIDINE GLUCONATE 0.12 % MT SOLN
15.0000 mL | Freq: Once | OROMUCOSAL | Status: AC
  Administered 2023-12-07: 15 mL via OROMUCOSAL

## 2023-12-07 MED ORDER — MONTELUKAST SODIUM 10 MG PO TABS
10.0000 mg | ORAL_TABLET | Freq: Every day | ORAL | Status: DC
  Administered 2023-12-07: 10 mg via ORAL
  Filled 2023-12-07: qty 1

## 2023-12-07 MED ORDER — FENTANYL CITRATE PF 50 MCG/ML IJ SOSY
PREFILLED_SYRINGE | INTRAMUSCULAR | Status: AC
Start: 2023-12-07 — End: 2023-12-07
  Filled 2023-12-07: qty 1

## 2023-12-07 MED ORDER — HYOSCYAMINE SULFATE 0.125 MG SL SUBL: 0.1250 mg | SUBLINGUAL_TABLET | SUBLINGUAL | Status: DC | PRN

## 2023-12-07 MED ORDER — ONDANSETRON HCL 4 MG/2ML IJ SOLN: 4.0000 mg | INTRAMUSCULAR | Status: DC | PRN

## 2023-12-07 MED ORDER — ORAL CARE MOUTH RINSE: 15.0000 mL | Freq: Once | OROMUCOSAL | Status: AC

## 2023-12-07 MED ORDER — PROPOFOL 10 MG/ML IV BOLUS
INTRAVENOUS | Status: DC | PRN
  Administered 2023-12-07: 180 mg via INTRAVENOUS

## 2023-12-07 MED ORDER — INSULIN ASPART 100 UNIT/ML IJ SOLN: 0.0000 [IU] | INTRAMUSCULAR | Status: DC | PRN

## 2023-12-07 MED ORDER — OXYCODONE HCL 5 MG/5ML PO SOLN: 5.0000 mg | Freq: Once | ORAL | Status: DC | PRN

## 2023-12-07 MED ORDER — ORAL CARE MOUTH RINSE: 15.0000 mL | OROMUCOSAL | Status: DC | PRN

## 2023-12-07 MED ORDER — PANTOPRAZOLE SODIUM 40 MG PO TBEC
80.0000 mg | DELAYED_RELEASE_TABLET | Freq: Every day | ORAL | Status: DC
  Administered 2023-12-08: 80 mg via ORAL
  Filled 2023-12-07: qty 2

## 2023-12-07 MED ORDER — ONDANSETRON HCL 4 MG/2ML IJ SOLN
INTRAMUSCULAR | Status: DC | PRN
  Administered 2023-12-07: 4 mg via INTRAVENOUS

## 2023-12-07 MED ORDER — FLEET ENEMA RE ENEM: 1.0000 | ENEMA | Freq: Once | RECTAL | Status: DC | PRN

## 2023-12-07 MED ORDER — OXYCODONE HCL 5 MG PO TABS: 5.0000 mg | ORAL_TABLET | Freq: Once | ORAL | Status: DC | PRN

## 2023-12-07 MED ORDER — DEXAMETHASONE SODIUM PHOSPHATE 10 MG/ML IJ SOLN
INTRAMUSCULAR | Status: DC | PRN
  Administered 2023-12-07: 10 mg via INTRAVENOUS

## 2023-12-07 MED ORDER — LIDOCAINE HCL (PF) 2 % IJ SOLN
INTRAMUSCULAR | Status: DC | PRN
  Administered 2023-12-07: 100 mg via INTRADERMAL

## 2023-12-07 MED ORDER — LACTATED RINGERS IV SOLN: INTRAVENOUS | Status: DC

## 2023-12-07 MED ORDER — SODIUM CHLORIDE 0.9 % IR SOLN
Status: DC | PRN
  Administered 2023-12-07: 9000 mL

## 2023-12-07 MED ORDER — SENNOSIDES-DOCUSATE SODIUM 8.6-50 MG PO TABS: 1.0000 | ORAL_TABLET | Freq: Every evening | ORAL | Status: DC | PRN

## 2023-12-07 MED ORDER — DEXAMETHASONE SODIUM PHOSPHATE 10 MG/ML IJ SOLN
INTRAMUSCULAR | Status: AC
  Filled 2023-12-07: qty 1

## 2023-12-07 MED ORDER — SODIUM CHLORIDE 0.9% FLUSH
3.0000 mL | Freq: Two times a day (BID) | INTRAVENOUS | Status: DC
  Administered 2023-12-07: 10 mL via INTRAVENOUS
  Administered 2023-12-07: 3 mL via INTRAVENOUS

## 2023-12-07 MED ORDER — ONDANSETRON HCL 4 MG/2ML IJ SOLN
INTRAMUSCULAR | Status: AC
  Filled 2023-12-07: qty 2

## 2023-12-07 MED ORDER — HYDROMORPHONE HCL 1 MG/ML IJ SOLN: 0.5000 mg | INTRAMUSCULAR | Status: DC | PRN

## 2023-12-07 MED ORDER — ALBUTEROL SULFATE (2.5 MG/3ML) 0.083% IN NEBU: 3.0000 mL | INHALATION_SOLUTION | Freq: Four times a day (QID) | RESPIRATORY_TRACT | Status: DC | PRN

## 2023-12-07 MED ORDER — BISACODYL 10 MG RE SUPP: 10.0000 mg | Freq: Every day | RECTAL | Status: DC | PRN

## 2023-12-07 SURGICAL SUPPLY — 19 items
BAG URINE DRAIN 2000ML AR STRL (UROLOGICAL SUPPLIES) IMPLANT
BAG URO CATCHER STRL LF (MISCELLANEOUS) ×2 IMPLANT
CATH FOLEY 3WAY 30CC 22FR (CATHETERS) IMPLANT
DRAPE FOOT SWITCH (DRAPES) ×2 IMPLANT
ELECT HOOK LOOP BIPOLAR (NEEDLE) IMPLANT
ELECT REM PT RETURN 15FT ADLT (MISCELLANEOUS) ×2 IMPLANT
GLOVE SURG SS PI 8.0 STRL IVOR (GLOVE) IMPLANT
GOWN STRL REUS W/ TWL XL LVL3 (GOWN DISPOSABLE) ×2 IMPLANT
HOLDER FOLEY CATH W/STRAP (MISCELLANEOUS) IMPLANT
KIT TURNOVER KIT A (KITS) IMPLANT
LOOP CUT BIPOLAR 24F LRG (ELECTROSURGICAL) IMPLANT
MANIFOLD NEPTUNE II (INSTRUMENTS) ×2 IMPLANT
PACK CYSTO (CUSTOM PROCEDURE TRAY) ×2 IMPLANT
PAD PREP 24X48 CUFFED NSTRL (MISCELLANEOUS) ×2 IMPLANT
SYR 30ML LL (SYRINGE) IMPLANT
SYR TOOMEY IRRIG 70ML (MISCELLANEOUS) ×1 IMPLANT
SYRINGE TOOMEY IRRIG 70ML (MISCELLANEOUS) IMPLANT
TUBING CONNECTING 10 (TUBING) ×2 IMPLANT
TUBING UROLOGY SET (TUBING) ×2 IMPLANT

## 2023-12-07 NOTE — Transfer of Care (Signed)
 Immediate Anesthesia Transfer of Care Note  Patient: Melvin Morrison  Procedure(s) Performed: TURP (TRANSURETHRAL RESECTION OF PROSTATE)  Patient Location: PACU  Anesthesia Type:General  Level of Consciousness: sedated  Airway & Oxygen Therapy: Patient Spontanous Breathing and Patient connected to face mask oxygen  Post-op Assessment: Report given to RN and Post -op Vital signs reviewed and stable  Post vital signs: Reviewed and stable  Last Vitals:  Vitals Value Taken Time  BP    Temp    Pulse 65 12/07/23 0932  Resp 16 12/07/23 0932  SpO2 99 % 12/07/23 0932  Vitals shown include unfiled device data.  Last Pain:  Vitals:   12/07/23 0737  TempSrc:   PainSc: 0-No pain         Complications: No notable events documented.

## 2023-12-07 NOTE — Progress Notes (Signed)
   12/07/23 2131  BiPAP/CPAP/SIPAP  $ Non-Invasive Ventilator  Non-Invasive Vent Initial  BiPAP/CPAP/SIPAP Pt Type Adult  BiPAP/CPAP/SIPAP DREAMSTATIOND  Dentures removed? Not applicable  Mask Size Medium (Pts home mask and tubing)  EPAP 9 cmH2O (Pts home setting 9)  FiO2 (%) 21 %  Patient Home Equipment No  Auto Titrate No  CPAP/SIPAP surface wiped down Yes  BiPAP/CPAP /SiPAP Vitals  Pulse Rate (!) 48  Resp 20  SpO2 95 %  Bilateral Breath Sounds Clear;Diminished  MEWS Score/Color  MEWS Score 1  MEWS Score Color Green

## 2023-12-07 NOTE — Anesthesia Preprocedure Evaluation (Signed)
 Anesthesia Evaluation  Patient identified by MRN, date of birth, ID band Patient awake    Reviewed: Allergy & Precautions, H&P , NPO status , Patient's Chart, lab work & pertinent test results  Airway Mallampati: II   Neck ROM: full    Dental   Pulmonary asthma , sleep apnea    breath sounds clear to auscultation       Cardiovascular + CAD   Rhythm:regular Rate:Normal     Neuro/Psych    GI/Hepatic ,GERD  ,,  Endo/Other  diabetes, Type 2    Renal/GU      Musculoskeletal  (+) Arthritis ,    Abdominal   Peds  Hematology   Anesthesia Other Findings   Reproductive/Obstetrics                             Anesthesia Physical Anesthesia Plan  ASA: 3  Anesthesia Plan: General   Post-op Pain Management:    Induction: Intravenous  PONV Risk Score and Plan: 2 and Ondansetron, Dexamethasone, Midazolam and Treatment may vary due to age or medical condition  Airway Management Planned: LMA  Additional Equipment:   Intra-op Plan:   Post-operative Plan: Extubation in OR  Informed Consent: I have reviewed the patients History and Physical, chart, labs and discussed the procedure including the risks, benefits and alternatives for the proposed anesthesia with the patient or authorized representative who has indicated his/her understanding and acceptance.     Dental advisory given  Plan Discussed with: CRNA, Anesthesiologist and Surgeon  Anesthesia Plan Comments:        Anesthesia Quick Evaluation

## 2023-12-07 NOTE — Op Note (Signed)
 Preoperative diagnosis: Bladder outlet obstruction secondary to BPH  Postoperative diagnosis:  Bladder outlet obstruction secondary to BPH  Procedure:  Cystoscopy Transurethral Resection of the prostate  Surgeon: [name]. M.D.  Anesthesia: general  Complications: None  EBL: 50ml  Specimens: Prostate chips  Disposition of specimens: Pathology  Indication: Melvin Morrison is a patient with bladder outlet obstruction secondary to benign prostatic hyperplasia. After reviewing the management options for treatment, he elected to proceed with the above surgical procedure(s). We have discussed the potential benefits and risks of the procedure, side effects of the proposed treatment, the likelihood of the patient achieving the goals of the procedure, and any potential problems that might occur during the procedure or recuperation. Informed consent has been obtained.  Description of procedure:  The patient was taken to the operating room and general anesthesia was induced.  The patient was placed in the dorsal lithotomy position, prepped and draped in the usual sterile fashion, and preoperative antibiotics were administered. A preoperative time-out was performed.   Cystourethroscopy was performed.  The patient's urethra was examined and was short but demonstrated bilobar prostatic hypertrophy.   The bladder was then systematically examined in its entirety. There was mild trabeculation with no evidence of any bladder tumors, stones, or other mucosal pathology.  The ureteral orifices were identified and marked so as to be avoided during the procedure.  The prostate adenoma was then resected utilizing loop cautery resection with the bipolar cutting loop.  The prostate adenoma from the bladder neck back to the verumontanum was resected beginning at the six o'clock position and then extended to include the right and left lobes of the prostate and anterior prostate. Care was taken not to  resect distal to the verumontanum.  At the completion of the procedure the bladder was evacuated free of chips and hemostasis was insured.  Final inspection revealed intact ureteral orifices, a widely patent TUR channel and an intact external sphincter.   Hemostasis was then achieved with the cautery and the bladder was emptied and reinspected with no significant bleeding noted at the end of the procedure.    A 15fr 3 way catheter was then placed into the bladder and placed on continuous bladder irrigation.  The patient appeared to tolerate the procedure well and without complications.  The patient was able to be awakened and transferred to the recovery unit in satisfactory condition.

## 2023-12-07 NOTE — Interval H&P Note (Signed)
 History and Physical Interval Note:  12/07/2023 8:22 AM  Melvin Morrison  has presented today for surgery, with the diagnosis of BENIGN PROSTATIC HYPERPLASIA.  The various methods of treatment have been discussed with the patient and family. After consideration of risks, benefits and other options for treatment, the patient has consented to  Procedure(s) with comments: TURP (TRANSURETHRAL RESECTION OF PROSTATE) (N/A) - 60 MINUTE CASE as a surgical intervention.  The patient's history has been reviewed, patient examined, no change in status, stable for surgery.  I have reviewed the patient's chart and labs.  Questions were answered to the patient's satisfaction.     [name]

## 2023-12-07 NOTE — TOC Initial Note (Signed)
 Transition of Care [facility]) - Initial/Assessment Note    Patient Details  Name: Melvin Morrison MRN: [id number] Date of Birth: [birth year]  [facility]) CM/SW Contact:    Lanier Clam, RN Phone Number: 12/07/2023, 2:39 PM  Clinical Narrative:  d/c plan home.                 Expected Discharge Plan: Home/Self Care Barriers to Discharge: Continued Medical Work up   Patient Goals and CMS Choice Patient states their goals for this hospitalization and ongoing recovery are:: Home CMS Medicare.gov Compare Post Acute Care list provided to:: Patient Choice offered to / list presented to : Patient [place] ownership interest in [facility].provided to:: Patient    Expected Discharge Plan and Services                                              Prior Living Arrangements/Services                       Activities of Daily Living   ADL Screening (condition at time of admission) Independently performs ADLs?: Yes (appropriate for developmental age) Is the patient deaf or have difficulty hearing?: No Does the patient have difficulty seeing, even when wearing glasses/contacts?: No Does the patient have difficulty concentrating, remembering, or making decisions?: No  Permission Sought/Granted                  Emotional Assessment              Admission diagnosis:  BPH with obstruction/lower urinary tract symptoms [N40.1, N13.8] Patient Active Problem List   Diagnosis Date Noted   BPH with obstruction/lower urinary tract symptoms 12/07/2023   GERD (gastroesophageal reflux disease) 01/04/2022   Chronic cough 01/04/2022   Diabetes mellitus (HCC) 01/04/2022   Hypercholesterolemia 01/04/2022   Prediabetes    Sleep apnea    BPH (benign prostatic hyperplasia)    PCP:  Donita Brooks, MD Pharmacy:   [phone] - [place], [place] - [address] AT [facility] [address] [place] [address] Phone:  [phone] Fax: [phone]     Social Drivers of Health (SDOH) Social History: SDOH Screenings   Food Insecurity: Patient Declined (12/07/2023)  Housing: Low Risk  (12/07/2023)  Transportation Needs: No Transportation Needs (11/28/2023)  Utilities: Not At Risk (06/14/2023)  Alcohol Screen: Low Risk  (11/28/2023)  Depression (PHQ2-9): Low Risk  (06/14/2023)  Financial Resource Strain: Patient Declined (11/28/2023)  Physical Activity: Insufficiently Active (11/28/2023)  Social Connections: Unknown (11/28/2023)  Stress: No Stress Concern Present (11/28/2023)  Tobacco Use: Low Risk  (12/07/2023)  Health Literacy: Adequate Health Literacy (06/14/2023)   SDOH Interventions:     Readmission Risk Interventions     No data to display

## 2023-12-07 NOTE — Anesthesia Postprocedure Evaluation (Signed)
 Anesthesia Post Note  Patient: Melvin Morrison  Procedure(s) Performed: TURP (TRANSURETHRAL RESECTION OF PROSTATE)     Patient location during evaluation: PACU Anesthesia Type: General Level of consciousness: awake and alert Pain management: pain level controlled Vital Signs Assessment: post-procedure vital signs reviewed and stable Respiratory status: spontaneous breathing, nonlabored ventilation, respiratory function stable and patient connected to nasal cannula oxygen Cardiovascular status: blood pressure returned to baseline and stable Postop Assessment: no apparent nausea or vomiting Anesthetic complications: no   No notable events documented.  Last Vitals:  Vitals:   12/07/23 1300 12/07/23 1348  BP: 115/67 111/63  Pulse: (!) 43   Resp: 15 16  Temp:  (!) 36.2 C  SpO2: 97% 96%    Last Pain:  Vitals:   12/07/23 1412  TempSrc:   PainSc: 2                  Darryel Diodato S

## 2023-12-07 NOTE — Anesthesia Procedure Notes (Signed)
 Procedure Name: LMA Insertion Date/Time: 12/07/2023 8:44 AM  Performed by: Doran Clay, CRNAPre-anesthesia Checklist: Emergency Drugs available, Patient identified, Suction available and Patient being monitored Patient Re-evaluated:Patient Re-evaluated prior to induction Oxygen Delivery Method: Circle system utilized Induction Type: IV induction LMA: LMA inserted LMA Size: 5.0 Tube type: Oral Number of attempts: 1 Placement Confirmation: positive ETCO2 and breath sounds checked- equal and bilateral Tube secured with: Tape Dental Injury: Teeth and Oropharynx as per pre-operative assessment

## 2023-12-08 ENCOUNTER — Encounter (HOSPITAL_COMMUNITY): Payer: Self-pay | Admitting: Urology

## 2023-12-08 DIAGNOSIS — N401 Enlarged prostate with lower urinary tract symptoms: Secondary | ICD-10-CM | POA: Diagnosis not present

## 2023-12-08 LAB — GLUCOSE, CAPILLARY
Glucose-Capillary: 127 mg/dL — ABNORMAL HIGH (ref 70–99)
Glucose-Capillary: 127 mg/dL — ABNORMAL HIGH (ref 70–99)

## 2023-12-08 MED ORDER — PHENAZOPYRIDINE HCL 200 MG PO TABS: 200.0000 mg | ORAL_TABLET | Freq: Three times a day (TID) | ORAL | 0 refills | Status: AC | PRN

## 2023-12-08 MED ORDER — OXYBUTYNIN CHLORIDE 5 MG PO TABS
5.0000 mg | ORAL_TABLET | Freq: Three times a day (TID) | ORAL | 1 refills | Status: AC | PRN
Start: 2023-12-08 — End: ?

## 2023-12-08 MED ORDER — PHENAZOPYRIDINE HCL 200 MG PO TABS
200.0000 mg | ORAL_TABLET | Freq: Three times a day (TID) | ORAL | Status: DC | PRN
  Administered 2023-12-08: 200 mg via ORAL
  Filled 2023-12-08 (×2): qty 1

## 2023-12-08 NOTE — Progress Notes (Signed)
 Patient to be discharged to home today. All discharge teaching/instructions including all discharge Medications and schedules for these Medications reviewed with the Patient and Patient's Wife. Understanding verbalized and discharge AVS with the Patient at time of discharge

## 2023-12-08 NOTE — Care Management Obs Status (Signed)
 MEDICARE OBSERVATION STATUS NOTIFICATION   Patient Details  Name: Melvin Morrison MRN: [id number] Date of Birth: 06/25/1951   Medicare Observation Status Notification Given:  Yes    Adrian Prows, RN 12/08/2023, 11:02 AM

## 2023-12-08 NOTE — TOC Transition Note (Signed)
 Transition of Care [facility]) - Discharge Note   Patient Details  Name: Melvin Morrison MRN: [id number] Date of Birth: July 09, 1951  [facility]) CM/SW Contact:  Adrian Prows, RN Phone Number: 12/08/2023, 11:06 AM   Clinical Narrative:    D/C orders received; no TOC needs.   Final next level of care: Home/Self Care Barriers to Discharge: No Barriers Identified   Patient Goals and CMS Choice Patient states their goals for this hospitalization and ongoing recovery are:: Home CMS Medicare.gov Compare Post Acute Care list provided to:: Patient Choice offered to / list presented to : Patient  ownership interest in [facility].provided to:: Patient    Discharge Placement                       Discharge Plan and Services Additional resources added to the After Visit Summary for                                       Social Drivers of Health (SDOH) Interventions SDOH Screenings   Food Insecurity: Patient Declined (12/07/2023)  Housing: Low Risk  (12/07/2023)  Transportation Needs: No Transportation Needs (12/07/2023)  Utilities: Not At Risk (12/07/2023)  Alcohol Screen: Low Risk  (11/28/2023)  Depression (PHQ2-9): Low Risk  (06/14/2023)  Financial Resource Strain: Patient Declined (11/28/2023)  Physical Activity: Insufficiently Active (11/28/2023)  Social Connections: Unknown (12/07/2023)  Stress: No Stress Concern Present (11/28/2023)  Tobacco Use: Low Risk  (12/07/2023)  Health Literacy: Adequate Health Literacy (06/14/2023)     Readmission Risk Interventions     No data to display

## 2023-12-11 LAB — SURGICAL PATHOLOGY

## 2023-12-18 NOTE — Discharge Summary (Signed)
 Date of admission: 12/07/2023  Date of discharge: 12/08/2023  Admission diagnosis: BPH with LUTS  Discharge diagnosis: Same  Procedures: TURP with Dr. Annabell Howells on 12/07/23  History and Physical: For full details, please see admission history and physical. Briefly, Melvin Morrison is a 73 y.o. year old patient with BPH with LUTS. He is s/p TURP with Dr. Annabell Howells on 12/07/23.   Hospital Course: Routine post-op course following TURP.  The patient was discharged home w/o his Foley catheter after passing his voiding trial.   Physical Exam:  General: Alert and oriented CV: RRR, palpable distal pulses Lungs: CTAB, equal chest rise Abdomen: Soft, NTND, no rebound or guarding   Laboratory values: No results for input(s): "HGB", "HCT" in the last 72 hours. No results for input(s): "CREATININE" in the last 72 hours.  Disposition: Home  Discharge instruction: The patient was instructed to be ambulatory but told to refrain from heavy lifting, strenuous activity, or driving.  Discharge medications:  Allergies as of 12/08/2023   No Known Allergies      Medication List     TAKE these medications    albuterol 108 (90 Base) MCG/ACT inhaler Commonly known as: VENTOLIN HFA INHALE 2 PUFFS INTO THE LUNGS EVERY 6 HOURS AS NEEDED FOR WHEEZING OR SHORTNESS OF BREATH   blood glucose meter kit and supplies Kit Dispense based on patient and insurance preference. Use up to four times daily as directed.   clotrimazole-betamethasone cream Commonly known as: LOTRISONE APPLY TO AFFECTED AREA TWICE A DAY What changed: See the new instructions.   empagliflozin 25 MG Tabs tablet Commonly known as: Jardiance Take 1 tablet (25 mg total) by mouth daily.   esomeprazole 40 MG capsule Commonly known as: NEXIUM TAKE 1 CAPSULE BY MOUTH EVERY DAY   meloxicam 15 MG tablet Commonly known as: MOBIC Take 1 tablet (15 mg total) by mouth daily.   montelukast 10 MG tablet Commonly known as: SINGULAIR TAKE 1 TABLET  BY MOUTH EVERYDAY AT BEDTIME   oxybutynin 5 MG tablet Commonly known as: DITROPAN Take 1 tablet (5 mg total) by mouth every 8 (eight) hours as needed for bladder spasms.   phenazopyridine 200 MG tablet Commonly known as: PYRIDIUM Take 1 tablet (200 mg total) by mouth 3 (three) times daily as needed (dysuria).   rosuvastatin 40 MG tablet Commonly known as: CRESTOR TAKE 1 TABLET BY MOUTH EVERY DAY   sildenafil 100 MG tablet Commonly known as: VIAGRA Take 100 mg by mouth as needed for erectile dysfunction.   tirzepatide 10 MG/0.5ML Pen Commonly known as: MOUNJARO Inject 10 mg into the skin once a week.        Followup:   Follow-up Information     [facility] Follow up on 12/21/2023.   Why: 215p Contact information: [address] [place] [address] [phone]

## 2023-12-24 ENCOUNTER — Encounter: Payer: Self-pay | Admitting: Family Medicine

## 2023-12-24 DIAGNOSIS — C61 Malignant neoplasm of prostate: Secondary | ICD-10-CM | POA: Insufficient documentation

## 2024-01-03 ENCOUNTER — Other Ambulatory Visit: Payer: Self-pay | Admitting: Family Medicine

## 2024-01-03 DIAGNOSIS — E118 Type 2 diabetes mellitus with unspecified complications: Secondary | ICD-10-CM

## 2024-01-03 NOTE — Telephone Encounter (Signed)
 Requested medication (s) are due for refill today - yes  Requested medication (s) are on the active medication list -yes  Future visit scheduled -no  Last refill: 07/11/23 6ml 1RF  Notes to clinic: off protocol- provider review   Requested Prescriptions  Pending Prescriptions Disp Refills   MOUNJARO 10 MG/0.5ML Pen [Pharmacy Med Name: MOUNJARO 10 MG/0.5 ML PEN]  1    Sig: INJECT 10 MG INTO THE SKIN ONE TIME PER WEEK     Off-Protocol Failed - 01/03/2024  8:58 AM      Failed - Medication not assigned to a protocol, review manually.      Passed - Valid encounter within last 12 months    Recent Outpatient Visits           1 month ago Controlled type 2 diabetes mellitus with complication, with long-term current use of insulin (HCC)   [place] [facility] Pickard, Cisco Crest, MD   7 months ago Controlled type 2 diabetes mellitus with complication, without long-term current use of insulin [facility])   [place] [facility] Pickard, Cisco Crest, MD   1 year ago General medical exam   [place] [facility] Medicine Austine Lefort, MD   1 year ago Uncontrolled type 2 diabetes mellitus with hyperglycemia [facility])   [place] [facility] Medicine Austine Lefort, MD                 Requested Prescriptions  Pending Prescriptions Disp Refills   MOUNJARO 10 MG/0.5ML Pen [Pharmacy Med Name: MOUNJARO 10 MG/0.5 ML PEN]  1    Sig: INJECT 10 MG INTO THE SKIN ONE TIME PER WEEK     Off-Protocol Failed - 01/03/2024  8:58 AM      Failed - Medication not assigned to a protocol, review manually.      Passed - Valid encounter within last 12 months    Recent Outpatient Visits           1 month ago Controlled type 2 diabetes mellitus with complication, with long-term current use of insulin (HCC)   [place] [facility] Austine Lefort, MD   7 months ago Controlled type 2 diabetes mellitus with complication, without  long-term current use of insulin [facility])   [place] [facility] Medicine Pickard, Cisco Crest, MD   1 year ago General medical exam    [facility] Family Medicine Austine Lefort, MD   1 year ago Uncontrolled type 2 diabetes mellitus with hyperglycemia [facility])    [facility] Medicine Pickard, Cisco Crest, MD

## 2024-01-28 ENCOUNTER — Other Ambulatory Visit: Payer: Self-pay

## 2024-01-28 DIAGNOSIS — J45901 Unspecified asthma with (acute) exacerbation: Secondary | ICD-10-CM

## 2024-01-28 NOTE — Telephone Encounter (Signed)
 Prescription Request  01/28/2024  LOV: 11/30/23  What is the name of the medication or equipment? montelukast  (SINGULAIR ) 10 MG tablet [[id number]]   Have you contacted your pharmacy to request a refill? Yes   Which pharmacy would you like this sent to?  [id number] [place], [place] - [address] [place] [address] Phone: [phone] Fax: [phone]    Patient notified that their request is being sent to the clinical staff for review and that they should receive a response within 2 business days.   Please advise at [facility] [phone]

## 2024-01-30 MED ORDER — MONTELUKAST SODIUM 10 MG PO TABS
ORAL_TABLET | ORAL | 0 refills | Status: DC
Start: 2024-01-30 — End: 2024-04-21

## 2024-01-30 NOTE — Telephone Encounter (Signed)
 Requested Prescriptions  Pending Prescriptions Disp Refills   montelukast  (SINGULAIR ) 10 MG tablet 90 tablet 0    Sig: TAKE 1 TABLET BY MOUTH EVERYDAY AT BEDTIME     Pulmonology:  Leukotriene Inhibitors Passed - 01/30/2024  1:46 PM      Passed - Valid encounter within last 12 months    Recent Outpatient Visits           2 months ago Controlled type 2 diabetes mellitus with complication, with long-term current use of insulin  [facility])   [place] [facility] Medicine Austine Lefort, MD   8 months ago Controlled type 2 diabetes mellitus with complication, without long-term current use of insulin  [facility])   [facility] Family Medicine Pickard, Cisco Crest, MD   1 year ago General medical exam   [place] [facility] Austine Lefort, MD   1 year ago Uncontrolled type 2 diabetes mellitus with hyperglycemia [facility])   [place] [facility] Family Medicine Pickard, Cisco Crest, MD

## 2024-02-21 ENCOUNTER — Other Ambulatory Visit: Payer: Self-pay | Admitting: Family Medicine

## 2024-02-21 DIAGNOSIS — E118 Type 2 diabetes mellitus with unspecified complications: Secondary | ICD-10-CM

## 2024-02-21 NOTE — Telephone Encounter (Signed)
 Copied from CRM [id number]. Topic: Clinical - Medication Refill >> Feb 21, 2024  2:42 PM Fonda T wrote: Medication: tirzepatide  (MOUNJARO ) 10 MG/0.5ML Pen  **Per patient needs to increase to next dose which is 12 MG, for 90 day supply**  Has the patient contacted their pharmacy? Yes (Agent: If no, request that the patient contact the pharmacy for the refill. If patient does not wish to contact the pharmacy document the reason why and proceed with request.) (Agent: If yes, when and what did the pharmacy advise?)  This is the patient's preferred pharmacy:  [id number] [place] [place] - [address] [place] [address] Phone: [phone] Fax: [phone]  Is this the correct pharmacy for this prescription? Yes If no, delete pharmacy and type the correct one.   Has the prescription been filled recently? Yes  Is the patient out of the medication? No  Has the patient been seen for an appointment in the last year OR does the patient have an upcoming appointment? Yes  Can we respond through MyChart? Yes  Agent: Please be advised that Rx refills may take up to 3 business days. We ask that you follow-up with your pharmacy.

## 2024-02-22 NOTE — Telephone Encounter (Signed)
 Requested medication (s) are due for refill today:   Provider to reveiw  Requested medication (s) are on the active medication list:   Yes  Future visit scheduled:   Yes 9/29    LOV 11/30/2023   Last ordered: Unable to refill because no protocol for this medication plus pt is ready for 12 mg dose.      Requested Prescriptions  Pending Prescriptions Disp Refills   tirzepatide  (MOUNJARO ) 10 MG/0.5ML Pen 2 mL 1     Off-Protocol Failed - 02/22/2024  1:05 PM      Failed - Medication not assigned to a protocol, review manually.      Passed - Valid encounter within last 12 months    Recent Outpatient Visits           2 months ago Controlled type 2 diabetes mellitus with complication, with long-term current use of insulin  [facility])   [place] [facility] Medicine Austine Lefort, MD   9 months ago Controlled type 2 diabetes mellitus with complication, without long-term current use of insulin  [facility])   [place] [facility] Medicine Pickard, Cisco Crest, MD   1 year ago General medical exam   [place] [facility] Medicine Austine Lefort, MD   1 year ago Uncontrolled type 2 diabetes mellitus with hyperglycemia [facility])    [facility] Family Medicine Pickard, Cisco Crest, MD

## 2024-02-25 MED ORDER — MOUNJARO 10 MG/0.5ML ~~LOC~~ SOAJ
10.0000 mg | SUBCUTANEOUS | 1 refills | Status: DC
Start: 2024-02-25 — End: 2024-04-17

## 2024-03-13 ENCOUNTER — Ambulatory Visit

## 2024-03-13 DIAGNOSIS — Z794 Long term (current) use of insulin: Secondary | ICD-10-CM | POA: Diagnosis not present

## 2024-03-13 DIAGNOSIS — E118 Type 2 diabetes mellitus with unspecified complications: Secondary | ICD-10-CM | POA: Diagnosis not present

## 2024-03-13 LAB — HM DIABETES EYE EXAM

## 2024-03-13 NOTE — Progress Notes (Signed)
 Melvin Morrison arrived 03/13/2024 and has given verbal consent to obtain images and complete their overdue diabetic retinal screening.  The images have been sent to an ophthalmologist or optometrist for review and interpretation.  Results will be sent back to Duanne Butler DASEN, MD for review.  Patient has been informed they will be contacted when we receive the results via telephone or MyChart

## 2024-03-14 NOTE — Progress Notes (Signed)
 Received results- No diabetic retinopathy shown. Routed exam to provider. Patient is aware of results and NO referral needed.

## 2024-03-16 NOTE — Progress Notes (Signed)
 I have collaborated with the care management provider regarding care management and care coordination activities outlined in this encounter and have reviewed this encounter including documentation in the note and care plan. I am certifying that I agree with the content of this note and encounter as supervising physician.

## 2024-03-17 ENCOUNTER — Other Ambulatory Visit: Payer: Self-pay | Admitting: Urology

## 2024-03-17 DIAGNOSIS — C61 Malignant neoplasm of prostate: Secondary | ICD-10-CM

## 2024-03-19 ENCOUNTER — Encounter: Payer: Self-pay | Admitting: Urology

## 2024-04-02 ENCOUNTER — Inpatient Hospital Stay
Admission: RE | Admit: 2024-04-02 | Discharge: 2024-04-02 | Disposition: A | Discharge status: Home / Self Care | Source: Ambulatory Visit | Attending: Urology | Admitting: Urology

## 2024-04-02 DIAGNOSIS — C61 Malignant neoplasm of prostate: Secondary | ICD-10-CM

## 2024-04-02 MED ORDER — GADOPICLENOL 0.5 MMOL/ML IV SOLN
10.0000 mL | Freq: Once | INTRAVENOUS | Status: AC | PRN
  Administered 2024-04-02: 10 mL via INTRAVENOUS

## 2024-04-17 ENCOUNTER — Other Ambulatory Visit: Payer: Self-pay | Admitting: Family Medicine

## 2024-04-17 DIAGNOSIS — E118 Type 2 diabetes mellitus with unspecified complications: Secondary | ICD-10-CM

## 2024-04-21 ENCOUNTER — Other Ambulatory Visit: Payer: Self-pay | Admitting: Family Medicine

## 2024-04-21 DIAGNOSIS — J45901 Unspecified asthma with (acute) exacerbation: Secondary | ICD-10-CM

## 2024-05-26 ENCOUNTER — Other Ambulatory Visit: Payer: Self-pay | Admitting: Family Medicine

## 2024-05-26 NOTE — Telephone Encounter (Signed)
 Prescription Request  05/26/2024  LOV: 11/30/2023  What is the name of the medication or equipment? rosuvastatin  (CRESTOR ) 40 MG tablet   Have you contacted your pharmacy to request a refill? Yes   Which pharmacy would you like this sent to?  [id number] [place], [place] - [address] [facility] [address] [place] [address] Phone: [phone] Fax: [phone]    Patient notified that their request is being sent to the clinical staff for review and that they should receive a response within 2 business days.   Please advise at [facility] [phone]

## 2024-05-27 MED ORDER — ROSUVASTATIN CALCIUM 40 MG PO TABS: 40.0000 mg | ORAL_TABLET | Freq: Every day | ORAL | 0 refills | Status: DC

## 2024-05-27 NOTE — Telephone Encounter (Signed)
 Requested Prescriptions  Pending Prescriptions Disp Refills   rosuvastatin  (CRESTOR ) 40 MG tablet 90 tablet 0    Sig: Take 1 tablet (40 mg total) by mouth daily.     Cardiovascular:  Antilipid - Statins 2 Failed - 05/27/2024 12:12 PM      Failed - Lipid Panel in normal range within the last 12 months    Cholesterol  Date Value Ref Range Status  11/30/2023 91 <200 mg/dL Final   LDL Cholesterol (Calc)  Date Value Ref Range Status  11/30/2023 32 mg/dL (calc) Final    Comment:    Reference range: <100 . Desirable range <100 mg/dL for primary prevention;   <70 mg/dL for patients with CHD or diabetic patients  with > or = 2 CHD risk factors. SABRA LDL-C is now calculated using the Martin-Hopkins  calculation, which is a validated novel method providing  better accuracy than the Friedewald equation in the  estimation of LDL-C.  Gladis APPLETHWAITE et al. SANDREA. 7986;689(80): 2061-2068  (http://education.[facility].com/faq/FAQ164)    HDL  Date Value Ref Range Status  11/30/2023 43 > OR = 40 mg/dL Final   Triglycerides  Date Value Ref Range Status  11/30/2023 78 <150 mg/dL Final         Passed - Cr in normal range and within 360 days    Creat  Date Value Ref Range Status  05/11/2023 1.17 0.70 - 1.28 mg/dL Final   Creatinine, Ser  Date Value Ref Range Status  11/26/2023 1.06 0.61 - 1.24 mg/dL Final         Passed - Patient is not pregnant      Passed - Valid encounter within last 12 months    Recent Outpatient Visits           5 months ago Controlled type 2 diabetes mellitus with complication, with long-term current use of insulin  (HCC)   [place] [facility] Duanne Butler DASEN, MD   1 year ago Controlled type 2 diabetes mellitus with complication, without long-term current use of insulin  [facility])   [place] [facility] Medicine Pickard, Butler DASEN, MD   2 years ago General medical exam   [place] [facility] Medicine Duanne Butler DASEN, MD    2 years ago Uncontrolled type 2 diabetes mellitus with hyperglycemia [facility])   [place] [facility] Medicine Pickard, Butler DASEN, MD

## 2024-06-07 ENCOUNTER — Other Ambulatory Visit: Payer: Self-pay | Admitting: Family Medicine

## 2024-06-07 DIAGNOSIS — E118 Type 2 diabetes mellitus with unspecified complications: Secondary | ICD-10-CM

## 2024-06-09 NOTE — Telephone Encounter (Signed)
 Requested medication (s) are due for refill today:   Yes  Requested medication (s) are on the active medication list:   Yes  Future visit scheduled:   Yes 9/29 with Dr. Duanne    LOV 11/30/2023   Last ordered: 04/17/2024 2 ml, 1 refill  No protocol assigned     Requested Prescriptions  Pending Prescriptions Disp Refills   MOUNJARO  10 MG/0.5ML Pen [Pharmacy Med Name: MOUNJARO  10 MG/0.5 ML PEN]  1    Sig: INJECT 10 MG INTO THE SKIN ONE TIME PER WEEK     Off-Protocol Failed - 06/09/2024  2:45 PM      Failed - Medication not assigned to a protocol, review manually.      Passed - Valid encounter within last 12 months    Recent Outpatient Visits           6 months ago Controlled type 2 diabetes mellitus with complication, with long-term current use of insulin  [facility])   [facility] Duanne Butler DASEN, MD   1 year ago Controlled type 2 diabetes mellitus with complication, without long-term current use of insulin  [facility])   [place] [facility] Pickard, Butler DASEN, MD   2 years ago General medical exam   [place] [facility] Medicine Duanne Butler DASEN, MD   2 years ago Uncontrolled type 2 diabetes mellitus with hyperglycemia [facility])   [place] [facility] Medicine Pickard, Butler DASEN, MD

## 2024-06-16 ENCOUNTER — Encounter: Payer: Self-pay | Admitting: Family Medicine

## 2024-06-16 ENCOUNTER — Ambulatory Visit (INDEPENDENT_AMBULATORY_CARE_PROVIDER_SITE_OTHER): Admitting: Family Medicine

## 2024-06-16 VITALS — BP 120/62 | HR 71 | Temp 98.3°F | Ht 73.0 in | Wt 243.8 lb

## 2024-06-16 DIAGNOSIS — E7800 Pure hypercholesterolemia, unspecified: Secondary | ICD-10-CM

## 2024-06-16 DIAGNOSIS — Z0001 Encounter for general adult medical examination with abnormal findings: Secondary | ICD-10-CM | POA: Diagnosis not present

## 2024-06-16 DIAGNOSIS — E118 Type 2 diabetes mellitus with unspecified complications: Secondary | ICD-10-CM

## 2024-06-16 DIAGNOSIS — Z7984 Long term (current) use of oral hypoglycemic drugs: Secondary | ICD-10-CM

## 2024-06-16 DIAGNOSIS — Z23 Encounter for immunization: Secondary | ICD-10-CM | POA: Diagnosis not present

## 2024-06-16 DIAGNOSIS — Z0000 Encounter for general adult medical examination without abnormal findings: Secondary | ICD-10-CM

## 2024-06-16 MED ORDER — TIRZEPATIDE 12.5 MG/0.5ML ~~LOC~~ SOAJ: 12.5000 mg | SUBCUTANEOUS | 3 refills | Status: AC

## 2024-06-16 MED ORDER — CLOTRIMAZOLE-BETAMETHASONE 1-0.05 % EX CREA: 1.0000 | TOPICAL_CREAM | Freq: Every day | CUTANEOUS | 4 refills | Status: AC | PRN
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
## 2024-06-17 ENCOUNTER — Ambulatory Visit: Payer: Self-pay | Admitting: Family Medicine

## 2024-06-17 LAB — HEMOGLOBIN A1C
Hgb A1c MFr Bld: 6.4 % — ABNORMAL HIGH (ref <5.7)
Mean Plasma Glucose: 137 mg/dL
eAG (mmol/L): 7.6 mmol/L

## 2024-06-17 LAB — LIPID PANEL
Cholesterol: 92 mg/dL (ref <200)
HDL: 42 mg/dL (ref >=40)
LDL Cholesterol (Calc): 35 mg/dL
Non-HDL Cholesterol (Calc): 50 mg/dL (ref <130)
Total CHOL/HDL Ratio: 2.2 (calc) (ref <5.0)
Triglycerides: 72 mg/dL (ref <150)

## 2024-06-17 LAB — COMPREHENSIVE METABOLIC PANEL WITH GFR
AG Ratio: 1.7 (calc) (ref 1.0–2.5)
ALT: 23 U/L (ref 9–46)
AST: 16 U/L (ref 10–35)
Albumin: 4.1 g/dL (ref 3.6–5.1)
Alkaline phosphatase (APISO): 64 U/L (ref 35–144)
BUN: 11 mg/dL (ref 7–25)
CO2: 30 mmol/L (ref 20–32)
Calcium: 8.9 mg/dL (ref 8.6–10.3)
Chloride: 105 mmol/L (ref 98–110)
Creat: 1.11 mg/dL (ref 0.70–1.28)
Globulin: 2.4 g/dL (ref 1.9–3.7)
Glucose, Bld: 117 mg/dL — ABNORMAL HIGH (ref 65–99)
Potassium: 4.1 mmol/L (ref 3.5–5.3)
Sodium: 142 mmol/L (ref 135–146)
Total Bilirubin: 0.9 mg/dL (ref 0.2–1.2)
Total Protein: 6.5 g/dL (ref 6.1–8.1)
eGFR: 71 mL/min/1.73m2 (ref >=60)

## 2024-06-17 LAB — CBC WITH DIFFERENTIAL/PLATELET
Absolute Lymphocytes: 1793 {cells}/uL (ref 850–3900)
Absolute Monocytes: 713 {cells}/uL (ref 200–950)
Basophils Absolute: 60 {cells}/uL (ref 0–200)
Basophils Relative: 0.8 %
Eosinophils Absolute: 180 {cells}/uL (ref 15–500)
Eosinophils Relative: 2.4 %
HCT: 46.4 % (ref 38.5–50.0)
Hemoglobin: 15.5 g/dL (ref 13.2–17.1)
MCH: 30.8 pg (ref 27.0–33.0)
MCHC: 33.4 g/dL (ref 32.0–36.0)
MCV: 92.1 fL (ref 80.0–100.0)
MPV: 9.9 fL (ref 7.5–12.5)
Monocytes Relative: 9.5 %
Neutro Abs: 4755 {cells}/uL (ref 1500–7800)
Neutrophils Relative %: 63.4 %
Platelets: 268 Thousand/uL (ref 140–400)
RBC: 5.04 Million/uL (ref 4.20–5.80)
RDW: 12.2 % (ref 11.0–15.0)
Total Lymphocyte: 23.9 %
WBC: 7.5 Thousand/uL (ref 3.8–10.8)

## 2024-06-17 LAB — MICROALBUMIN / CREATININE URINE RATIO
Creatinine, Urine: 127 mg/dL (ref 20–320)
Microalb Creat Ratio: 3 mg/g{creat} (ref <30)
Microalb, Ur: 0.4 mg/dL

## 2024-06-19 ENCOUNTER — Ambulatory Visit: Payer: Medicare Other | Admitting: *Deleted

## 2024-06-19 VITALS — Ht 73.0 in | Wt 243.0 lb

## 2024-06-19 DIAGNOSIS — Z0000 Encounter for general adult medical examination without abnormal findings: Secondary | ICD-10-CM | POA: Diagnosis not present
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
## 2024-06-19 NOTE — Patient Instructions (Signed)
 Melvin Morrison , Thank you for taking time to come for your Medicare Wellness Visit. I appreciate your ongoing commitment to your health goals. Please review the following plan we discussed and let me know if I can assist you in the future.   Screening recommendations/referrals: Colonoscopy:  Recommended yearly ophthalmology/optometry visit for glaucoma screening and checkup Recommended yearly dental visit for hygiene and checkup  Vaccinations: Influenza vaccine:  Pneumococcal vaccine:  Tdap vaccine:  Shingles vaccine:       Preventive Care 65 Years and Older, Male Preventive care refers to lifestyle choices and visits with your health care provider that can promote health and wellness. What does preventive care include? A yearly physical exam. This is also called an annual well check. Dental exams once or twice a year. Routine eye exams. Ask your health care provider how often you should have your eyes checked. Personal lifestyle choices, including: Daily care of your teeth and gums. Regular physical activity. Eating a healthy diet. Avoiding tobacco and drug use. Limiting alcohol use. Practicing safe sex. Taking low doses of aspirin every day. Taking vitamin and mineral supplements as recommended by your health care provider. What happens during an annual well check? The services and screenings done by your health care provider during your annual well check will depend on your age, overall health, lifestyle risk factors, and family history of disease. Counseling  Your health care provider may ask you questions about your: Alcohol use. Tobacco use. Drug use. Emotional well-being. Home and relationship well-being. Sexual activity. Eating habits. History of falls. Memory and ability to understand (cognition). Work and work Astronomer. Screening  You may have the following tests or measurements: Height, weight, and BMI. Blood pressure. Lipid and cholesterol levels. These  may be checked every 5 years, or more frequently if you are over 42 years old. Skin check. Lung cancer screening. You may have this screening every year starting at age 37 if you have a 30-pack-year history of smoking and currently smoke or have quit within the past 15 years. Fecal occult blood test (FOBT) of the stool. You may have this test every year starting at age 72. Flexible sigmoidoscopy or colonoscopy. You may have a sigmoidoscopy every 5 years or a colonoscopy every 10 years starting at age 57. Prostate cancer screening. Recommendations will vary depending on your family history and other risks. Hepatitis C blood test. Hepatitis B blood test. Sexually transmitted disease (STD) testing. Diabetes screening. This is done by checking your blood sugar (glucose) after you have not eaten for a while (fasting). You may have this done every 1-3 years. Abdominal aortic aneurysm (AAA) screening. You may need this if you are a current or former smoker. Osteoporosis. You may be screened starting at age 71 if you are at high risk. Talk with your health care provider about your test results, treatment options, and if necessary, the need for more tests. Vaccines  Your health care provider may recommend certain vaccines, such as: Influenza vaccine. This is recommended every year. Tetanus, diphtheria, and acellular pertussis (Tdap, Td) vaccine. You may need a Td booster every 10 years. Zoster vaccine. You may need this after age 67. Pneumococcal 13-valent conjugate (PCV13) vaccine. One dose is recommended after age 41. Pneumococcal polysaccharide (PPSV23) vaccine. One dose is recommended after age 8. Talk to your health care provider about which screenings and vaccines you need and how often you need them. This information is not intended to replace advice given to you by your health care  provider. Make sure you discuss any questions you have with your health care provider. Document Released:  10/01/2015 Document Revised: 05/24/2016 Document Reviewed: 07/06/2015 Elsevier Interactive Patient Education  2017 [employer].  Fall Prevention in the Home Falls can cause injuries. They can happen to people of all ages. There are many things you can do to make your home safe and to help prevent falls. What can I do on the outside of my home? Regularly fix the edges of walkways and driveways and fix any cracks. Remove anything that might make you trip as you walk through a door, such as a raised step or threshold. Trim any bushes or trees on the path to your home. Use bright outdoor lighting. Clear any walking paths of anything that might make someone trip, such as rocks or tools. Regularly check to see if handrails are loose or broken. Make sure that both sides of any steps have handrails. Any raised decks and porches should have guardrails on the edges. Have any leaves, snow, or ice cleared regularly. Use sand or salt on walking paths during winter. Clean up any spills in your garage right away. This includes oil or grease spills. What can I do in the bathroom? Use night lights. Install grab bars by the toilet and in the tub and shower. Do not use towel bars as grab bars. Use non-skid mats or decals in the tub or shower. If you need to sit down in the shower, use a plastic, non-slip stool. Keep the floor dry. Clean up any water that spills on the floor as soon as it happens. Remove soap buildup in the tub or shower regularly. Attach bath mats securely with double-sided non-slip rug tape. Do not have throw rugs and other things on the floor that can make you trip. What can I do in the bedroom? Use night lights. Make sure that you have a light by your bed that is easy to reach. Do not use any sheets or blankets that are too big for your bed. They should not hang down onto the floor. Have a firm chair that has side arms. You can use this for support while you get dressed. Do not have  throw rugs and other things on the floor that can make you trip. What can I do in the kitchen? Clean up any spills right away. Avoid walking on wet floors. Keep items that you use a lot in easy-to-reach places. If you need to reach something above you, use a strong step stool that has a grab bar. Keep electrical cords out of the way. Do not use floor polish or wax that makes floors slippery. If you must use wax, use non-skid floor wax. Do not have throw rugs and other things on the floor that can make you trip. What can I do with my stairs? Do not leave any items on the stairs. Make sure that there are handrails on both sides of the stairs and use them. Fix handrails that are broken or loose. Make sure that handrails are as long as the stairways. Check any carpeting to make sure that it is firmly attached to the stairs. Fix any carpet that is loose or worn. Avoid having throw rugs at the top or bottom of the stairs. If you do have throw rugs, attach them to the floor with carpet tape. Make sure that you have a light switch at the top of the stairs and the bottom of the stairs. If you do not have  them, ask someone to add them for you. What else can I do to help prevent falls? Wear shoes that: Do not have high heels. Have rubber bottoms. Are comfortable and fit you well. Are closed at the toe. Do not wear sandals. If you use a stepladder: Make sure that it is fully opened. Do not climb a closed stepladder. Make sure that both sides of the stepladder are locked into place. Ask someone to hold it for you, if possible. Clearly mark and make sure that you can see: Any grab bars or handrails. First and last steps. Where the edge of each step is. Use tools that help you move around (mobility aids) if they are needed. These include: Canes. Walkers. Scooters. Crutches. Turn on the lights when you go into a dark area. Replace any light bulbs as soon as they burn out. Set up your furniture so  you have a clear path. Avoid moving your furniture around. If any of your floors are uneven, fix them. If there are any pets around you, be aware of where they are. Review your medicines with your doctor. Some medicines can make you feel dizzy. This can increase your chance of falling. Ask your doctor what other things that you can do to help prevent falls. This information is not intended to replace advice given to you by your health care provider. Make sure you discuss any questions you have with your health care provider. Document Released: 07/01/2009 Document Revised: 02/10/2016 Document Reviewed: 10/09/2014 Elsevier Interactive Patient Education  2017 [employer].

## 2024-07-21 ENCOUNTER — Other Ambulatory Visit: Payer: Self-pay

## 2024-07-21 ENCOUNTER — Telehealth: Payer: Self-pay

## 2024-07-21 DIAGNOSIS — J45901 Unspecified asthma with (acute) exacerbation: Secondary | ICD-10-CM

## 2024-07-21 MED ORDER — MONTELUKAST SODIUM 10 MG PO TABS
ORAL_TABLET | ORAL | 1 refills | Status: AC
Start: 2024-07-21 — End: ?

## 2024-07-21 NOTE — Telephone Encounter (Signed)
 Prescription Request  07/21/2024  LOV: 06/16/24  What is the name of the medication or equipment? montelukast  (SINGULAIR ) 10 MG tablet [[id number]]   Have you contacted your pharmacy to request a refill? Yes   Which pharmacy would you like this sent to?  [id number] [address], [place] - [address] [facility] [address] [place] [address] Phone: [phone] Fax: [phone]    Patient notified that their request is being sent to the clinical staff for review and that they should receive a response within 2 business days.   Please advise at [facility] [phone]

## 2024-07-31 ENCOUNTER — Other Ambulatory Visit: Payer: Self-pay

## 2024-07-31 DIAGNOSIS — J4520 Mild intermittent asthma, uncomplicated: Secondary | ICD-10-CM

## 2024-07-31 NOTE — Telephone Encounter (Signed)
 Prescription Request  07/31/2024  LOV: 06/16/24  What is the name of the medication or equipment? albuterol  (PROAIR  HFA) 108 (90 Base) MCG/ACT inhaler [[id number]]  DISCONTINUED   Have you contacted your pharmacy to request a refill? Yes   Which pharmacy would you like this sent to?  [id number], [place] - [address] [facility] [address] [place] [address] Phone: [phone] Fax: [phone]    Patient notified that their request is being sent to the clinical staff for review and that they should receive a response within 2 business days.   Please advise at [facility] [phone]

## 2024-08-05 ENCOUNTER — Telehealth: Payer: Self-pay

## 2024-08-05 ENCOUNTER — Other Ambulatory Visit: Payer: Self-pay

## 2024-08-05 DIAGNOSIS — J4520 Mild intermittent asthma, uncomplicated: Secondary | ICD-10-CM

## 2024-08-05 MED ORDER — ALBUTEROL SULFATE HFA 108 (90 BASE) MCG/ACT IN AERS: INHALATION_SPRAY | RESPIRATORY_TRACT | 11 refills | Status: AC

## 2024-08-05 NOTE — Telephone Encounter (Signed)
 Copied from CRM [id number]. Topic: Clinical - Prescription Issue >> Aug 05, 2024 11:49 AM Winona R wrote: Pt wife calling to check the status of the pt med refill for  albuterol  (VENTOLIN  HFA) 108 (90 Base) MCG/ACT inhaler [[id number]] reordered by cma on 11/13

## 2024-08-11 ENCOUNTER — Telehealth: Payer: Self-pay

## 2024-08-11 ENCOUNTER — Other Ambulatory Visit: Payer: Self-pay

## 2024-08-11 MED ORDER — ESOMEPRAZOLE MAGNESIUM 40 MG PO CPDR: DELAYED_RELEASE_CAPSULE | ORAL | 3 refills | Status: AC

## 2024-08-11 NOTE — Telephone Encounter (Signed)
 Prescription Request  08/11/2024  LOV: 06/16/24  What is the name of the medication or equipment? esomeprazole  (NEXIUM ) 40 MG capsule [[id number]]   Have you contacted your pharmacy to request a refill? Yes   Which pharmacy would you like this sent to?  [id number] [address], [place] - [address] [facility] [address] [place] [address] Phone: [phone] Fax: [phone]    Patient notified that their request is being sent to the clinical staff for review and that they should receive a response within 2 business days.   Please advise at [facility] [phone]

## 2024-08-11 NOTE — Telephone Encounter (Signed)
 Sent in medication

## 2024-08-27 ENCOUNTER — Other Ambulatory Visit: Payer: Self-pay | Admitting: Family Medicine

## 2024-09-02 ENCOUNTER — Encounter: Payer: Self-pay | Admitting: Family Medicine

## 2024-09-02 ENCOUNTER — Ambulatory Visit: Admitting: Family Medicine

## 2024-09-02 VITALS — BP 120/72 | HR 97 | Temp 98.4°F | Ht 73.0 in | Wt 243.0 lb

## 2024-09-02 DIAGNOSIS — R52 Pain, unspecified: Secondary | ICD-10-CM

## 2024-09-02 LAB — INFLUENZA A AND B AG, IMMUNOASSAY
INFLUENZA A ANTIGEN: NOT DETECTED
INFLUENZA B ANTIGEN: NOT DETECTED

## 2024-09-02 MED ORDER — HYDROCODONE BIT-HOMATROP MBR 5-1.5 MG/5ML PO SOLN: 5.0000 mL | Freq: Three times a day (TID) | ORAL | 0 refills | Status: AC | PRN
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
## 2025-06-18 ENCOUNTER — Encounter: Admitting: Family Medicine

## 2025-06-25 ENCOUNTER — Ambulatory Visit
